# Patient Record
Sex: Female | Born: 1960
Health system: Southern US, Community
[De-identification: ages and names within clinical notes are randomized; demographics above are authoritative.]

---

## 1999-01-15 ENCOUNTER — Other Ambulatory Visit: Admission: RE | Admit: 1999-01-15 | Discharge: 1999-01-15 | Payer: Self-pay | Admitting: Obstetrics and Gynecology

## 1999-08-06 ENCOUNTER — Inpatient Hospital Stay (HOSPITAL_COMMUNITY): Admission: AD | Admit: 1999-08-06 | Discharge: 1999-08-10 | Payer: Self-pay | Admitting: Obstetrics and Gynecology

## 1999-08-11 ENCOUNTER — Encounter: Admission: RE | Admit: 1999-08-11 | Discharge: 1999-11-09 | Payer: Self-pay | Admitting: Obstetrics and Gynecology

## 1999-09-18 ENCOUNTER — Other Ambulatory Visit: Admission: RE | Admit: 1999-09-18 | Discharge: 1999-09-18 | Payer: Self-pay | Admitting: Obstetrics and Gynecology

## 2000-09-07 ENCOUNTER — Encounter (INDEPENDENT_AMBULATORY_CARE_PROVIDER_SITE_OTHER): Payer: Self-pay | Admitting: *Deleted

## 2000-09-07 LAB — CONVERTED CEMR LAB

## 2000-09-12 ENCOUNTER — Encounter: Admission: RE | Admit: 2000-09-12 | Discharge: 2000-09-12 | Payer: Self-pay | Admitting: Family Medicine

## 2001-05-05 ENCOUNTER — Encounter: Admission: RE | Admit: 2001-05-05 | Discharge: 2001-05-05 | Payer: Self-pay | Admitting: Sports Medicine

## 2001-07-25 ENCOUNTER — Other Ambulatory Visit: Admission: RE | Admit: 2001-07-25 | Discharge: 2001-07-25 | Payer: Self-pay | Admitting: Obstetrics and Gynecology

## 2001-08-23 ENCOUNTER — Encounter: Payer: Self-pay | Admitting: Obstetrics & Gynecology

## 2001-08-23 ENCOUNTER — Encounter (INDEPENDENT_AMBULATORY_CARE_PROVIDER_SITE_OTHER): Payer: Self-pay

## 2001-08-23 ENCOUNTER — Ambulatory Visit (HOSPITAL_COMMUNITY): Admission: RE | Admit: 2001-08-23 | Discharge: 2001-08-23 | Payer: Self-pay | Admitting: Obstetrics & Gynecology

## 2002-07-31 ENCOUNTER — Encounter: Admission: RE | Admit: 2002-07-31 | Discharge: 2002-07-31 | Payer: Self-pay | Admitting: Family Medicine

## 2002-09-26 ENCOUNTER — Other Ambulatory Visit: Admission: RE | Admit: 2002-09-26 | Discharge: 2002-09-26 | Payer: Self-pay | Admitting: Obstetrics and Gynecology

## 2002-10-24 ENCOUNTER — Encounter: Payer: Self-pay | Admitting: Obstetrics and Gynecology

## 2002-10-24 ENCOUNTER — Ambulatory Visit (HOSPITAL_COMMUNITY): Admission: RE | Admit: 2002-10-24 | Discharge: 2002-10-24 | Payer: Self-pay | Admitting: Obstetrics and Gynecology

## 2002-11-14 ENCOUNTER — Encounter: Payer: Self-pay | Admitting: Obstetrics and Gynecology

## 2002-11-14 ENCOUNTER — Ambulatory Visit (HOSPITAL_COMMUNITY): Admission: RE | Admit: 2002-11-14 | Discharge: 2002-11-14 | Payer: Self-pay | Admitting: Obstetrics and Gynecology

## 2003-03-29 ENCOUNTER — Encounter: Payer: Self-pay | Admitting: Obstetrics and Gynecology

## 2003-03-29 ENCOUNTER — Encounter (INDEPENDENT_AMBULATORY_CARE_PROVIDER_SITE_OTHER): Payer: Self-pay | Admitting: *Deleted

## 2003-03-29 ENCOUNTER — Inpatient Hospital Stay (HOSPITAL_COMMUNITY): Admission: AD | Admit: 2003-03-29 | Discharge: 2003-04-01 | Payer: Self-pay | Admitting: Obstetrics and Gynecology

## 2003-04-06 ENCOUNTER — Inpatient Hospital Stay (HOSPITAL_COMMUNITY): Admission: AD | Admit: 2003-04-06 | Discharge: 2003-04-06 | Payer: Self-pay | Admitting: Obstetrics and Gynecology

## 2003-06-21 ENCOUNTER — Encounter: Admission: RE | Admit: 2003-06-21 | Discharge: 2003-06-21 | Payer: Self-pay | Admitting: Family Medicine

## 2005-04-30 ENCOUNTER — Other Ambulatory Visit: Admission: RE | Admit: 2005-04-30 | Discharge: 2005-04-30 | Payer: Self-pay | Admitting: Obstetrics and Gynecology

## 2006-11-04 ENCOUNTER — Encounter (INDEPENDENT_AMBULATORY_CARE_PROVIDER_SITE_OTHER): Payer: Self-pay | Admitting: *Deleted

## 2007-09-19 ENCOUNTER — Encounter: Admission: RE | Admit: 2007-09-19 | Discharge: 2007-09-19 | Payer: Self-pay | Admitting: Obstetrics and Gynecology

## 2011-01-22 NOTE — Op Note (Signed)
[facility]  Patient:    [name]                          MRN: [id number] Proc. Date: 08/07/99 Adm. Date:  [id number] Attending:  Lenoard Aden                           Operative Report  PREOPERATIVE DIAGNOSIS:       Intrauterine pregnancy at 40 weeks 6 days, nonreassuring fetal heart rate.  POSTOPERATIVE DIAGNOSIS:      Intrauterine pregnancy at 40 weeks 6 days, nonreassuring fetal heart rate.  OP presentation.  Nuchal cord.  OPERATION:                    Primary low transverse cesarean section.  SURGEON:                      Silverio Lay, M.D.  ASSISTANT:                    Debbora Dus, N.P.  ANESTHESIA:                   Epidural.  ESTIMATED BLOOD LOSS:         600 cc.  DESCRIPTION OF PROCEDURE:     This is a 50 year old white female, gravida 1, being admitted on August 06, 1999, to undergo elective induction of labor for postdates with a very unfavorable cervix.  She was given Cervidil from August 06, 1999, at 4 a.m. until 3 p.m. in the afternoon and then Pitocin was started and artificial rupture of membranes was done.  She maintained a slow progress of labor, but had a few episodes of prolonged decellerations especially with standing up or sitting up with always good recuperation and maintaining good variability.  From 6:30 to 7:30 despite stopping Pitocin, giving the patient a bolus O2, and putting her in left lateral decubitus, fetal heart rate maintained severe variable decellerations at 40 beats per minute lasting 60 to 100 seconds.  Vaginal examination at that time was 8 cm with anterior lip edema, OP presentation -1.  Contractions were spaced every  five to seven minutes.  In view of the nonreassuring fetal heart rate, cesarean  section was suggested to the patient.  The procedure and complications including bleeding, infection, and trauma to the adjacent organs was explained to both the patient and husband  and consent was obtained.  The patient was taken to OR#2.  Epidural anesthesia was reinforced.  The patient was placed in the dorsal decubitus with pelvis tilted to the left.  She was prepped and draped in the usual sterile fashion.  A Foley catheter was already in placed. After assessing adequate level of anesthesia, the skin, subcutaneous tissue, and fat were incised in a low transverse fashion.  The fascia was incised in a low transverse fashion.  Linea alba was dissected and the peritoneum was entered in the midline fashion.  The visceroperitoneum was entered in a low transverse fashion  allowing [place] to develop a bladder flap and to retract bladder with a retractor. The uterus was then entered in the low transverse fashion first with scissors and then with knife.  Amniotic fluid was clear.  We assisted the birth of a female infant n OP presentation at 8:18 a.m.  The mouth and nose were suctioned before delivery of shoulders  and nuchal cord was reduced.  Cord was clamped with two Kelly clamps nd sectioned and the baby was given to the pediatrician present in the room.  20 cc of blood were drawn from the umbilical vein and 3 cc of blood was drawn from the umbilical artery for cord pH.  The placenta was spontaneously expelled, was complete, cord had three vessels, and uterine revision was negative.  The uterus was then closed in two layers, first with a running locked suture of 0 Vicryl, nd then with a Lambert running suture of 0 Vicryl covering the first one. Hemostasis on the uterine incision was assessed and adequate.  Both pericolic gutters were  cleansed.  Both tubes and ovaries assessed and normal.  Hemostasis was reassessed and normal.  Under fascia hemostasis was completed with cautery and fascia was closed with two running sutures of 0 Vicryl meeting in the midline.  The fat was irrigated with normal saline and hemostasis was obtained with cautery.  The skin was  infiltrated with Marcaine 0.25% 10 cc and the skin was closed with staples.  Estimated blood loss was 600 cc.  Sponge, needle, and instrument counts were correct x 2.  The baby received Apgars of 8 at one minute and 9 at five minutes. Weight 7 pounds 7 ounces, and cord pH was 7.2.  The procedure was very well tolerated by the patient who was taken to the recovery room in a well and stable condition. DD:  08/07/99 TD:  08/09/99 Job: [id number]

## 2011-01-22 NOTE — H&P (Signed)
   NAMEKENLEIGH, TOBACK                            ACCOUNT NO.:  [id number]   MEDICAL RECORD NO.:  [id number]                   PATIENT TYPE:  INP   LOCATION:  NA                                   [facility]   PHYSICIAN:  Sandra A. Rivard, M.D.              DATE OF BIRTH:  1961-02-19   DATE OF ADMISSION:  03/29/2003  DATE OF DISCHARGE:                                HISTORY & PHYSICAL   HISTORY OF PRESENT ILLNESS:  This is a 50 year old, G3, P1-0-1-1 at 37-6/7  weeks who presents for an elective repeat cesarean section.  Pregnancy has  been followed by Dr. Estanislado Pandy and remarkable for previous C-section desiring  repeat and BTL, AMA with normal amniocentesis and desiring elective  sterilization.   ALLERGIES:  No known drug allergies.   PAST OBSTETRICAL HISTORY:  Remarkable for cesarean section in 2000, of a  female infant at 88 weeks' gestation weighing 7 pounds 14 ounces, remarkable  for a body cord and fetal distress.  She had an elective abortion of twin  gestation in 2002, at 39 weeks' gestation.   PAST MEDICAL HISTORY:  1. Childhood varicella.  2. History of constipation.   PAST SURGICAL HISTORY:  1. Tonsillectomy in 11th grade.  2. D&C in 2002, with abortion.  3. C-section in 2000.   FAMILY HISTORY:  Remarkable for mother with varicosities.  Father with adult-  onset diabetes.   GENETIC HISTORY:  Remarkable for the patient's advanced maternal age.   SOCIAL HISTORY:  The patient is married to [employer] who is involved  and supportive.  She is a Futures trader.  She does not report a religious  affiliation.  She denies any alcohol, tobacco or drug use.   PHYSICAL EXAMINATION:  VITAL SIGNS:  Stable, afebrile.  HEENT:  Within normal limits.  Thyroid normal, not enlarged.  LUNGS:  Clear to auscultation.  HEART:  Regular rate and rhythm.  ABDOMEN:  Gravid.  PELVIC:  Deferred.  EXTREMITIES:  Within normal limits.   ASSESSMENT:  1. Intrauterine pregnancy at 37-6/7  weeks.  2. Previous cesarean section.  3. Desires repeat cesarean section and elective sterilization.    PLAN:  1. Admit to operating suite.  2. Further orders to follow.     Marie L. Williams, C.N.M.                 Crist Fat Rivard, M.D.    MLW/MEDQ  D:  03/29/2003  T:  03/29/2003  Job:  [id number]

## 2011-01-22 NOTE — Op Note (Signed)
   [name], [name]                            ACCOUNT NO.:  [id number]   MEDICAL RECORD NO.:  [id number]                   PATIENT TYPE:  OUT   LOCATION:  ULT                                  [facility]   PHYSICIAN:  Crist Fat. Rivard, M.D.              DATE OF BIRTH:  [birth year]   DATE OF PROCEDURE:  10/24/2002  DATE OF DISCHARGE:                                 OPERATIVE REPORT   PREOPERATIVE DIAGNOSES:  1. Intrauterine pregnancy at 15 weeks and 4 days.  2. Advanced maternal age.   POSTOPERATIVE DIAGNOSES:  1. Intrauterine pregnancy at 15 weeks and 4 days.  2. Advanced maternal age.   PROCEDURE:  Genetic amniocentesis.   SURGEON:  Crist Fat. Rivard, M.D.   COMPLICATIONS:  None.   PROCEDURE:  After being informed of the planned procedure with possible  complications including bleeding, infection, rupture of membranes, and  miscarriage, risks of 1 in 200, informed consent is obtained.  The patient  is in a dorsal decubitus position and a brief ultrasound is performed  locating an anterior left placenta, live intrauterine pregnancy with a fetal  heart rate of 130 beats per minute.  After locating a large pocket of  amniotic fluid, abdomen is prepped with Betadine.  Local infiltration with  lidocaine 1% using a 22-gauge Sono-VU needle we perform single puncture and  retrieve 1 mL of amniotic fluid.  For unknown reason are unable to retrieve  more fluid.  The needle is removed and a second site is chosen, again  prepped with Betadine.  Local infiltration with lidocaine 1%, 22-gauge  needle under ultrasound guidance.  We are now able to retrieve 24 mL of  clear fluid.  The needle is removed.  Post amniocentesis ultrasound is  normal with a heart rate of 133 beats per minute. The procedure is well  tolerated by the patient who is sent home on rest for 24 hours with routine  recommendations.                                               Crist Fat Rivard, M.D.    SAR/MEDQ   D:  10/24/2002  T:  10/24/2002  Job:  [id number]

## 2011-01-22 NOTE — Op Note (Signed)
[facility]  Patient:    [name], WIN Visit Number: [id number] MRN: [id number]          Service Type: DSU Location: [facility] Attending Physician:  Genia Del Dictated by:   Genia Del, M.D. Proc. 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  Decision to terminate.  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  Decision to terminate.  OPERATION:                    Dilatation and extraction under ultrasound guidance.  SURGEON:                      Genia Del, M.D.  ASSISTANT:  ANESTHESIA:                   Raul Del, M.D.  ESTIMATED BLOOD LOSS:  DESCRIPTION OF PROCEDURE:     Under general anesthesia with endotracheal intubation, the patient is in the lithotomy position.  She is prepped with Betadine on the suprapubic, vulvar, and vaginal area, and then draped as usual.  Vaginally, the sponge and the medium laminaria are removed.  We then inserted the speculum.  The cervix is grasped with a tenaculum on the anterior lip.  Dilatation is done with Hegar dilators easily up to #51.  A suction curet #16 is then used to rupture the membranes and suction the amniotic fluid.  We then used a large clamp with piece to remove fetal parts.  Fetal parts are counted.  We sent in a separate container parts from fetus A and B for chromosomal analysis.  We then used the large sharp curet for the placenta which was located posteriorly and came back at the end with the suction curet. All of the procedure is done under ultrasound guidance.  We confirmed an empty uterine cavity by ultrasound and the uterine sound is heard on all surfaces. The  uterus is contracting well and bleeding is minimal.  The estimated blood loss was 200 cc.  Urine was clear 150 cc at the end of the surgery.  No complications occurred and the patient was transferred to recovery room in good status.  The rest of the products of conception were sent for pathology. Her blood group is A negative, RhoGAM will be given postoperatively.  She will be sent home when stable on Flagyl 375 mg p.o. b.i.d. x 7 days and Tylox p.r.n.  She will follow up with me at [facility] in two weeks. Dictated by:   Genia Del, M.D. Attending Physician:  Genia Del DD:  08/23/01 TD:  08/23/01 Job: [id number]

## 2011-01-22 NOTE — Op Note (Signed)
[name], [name]                            ACCOUNT NO.:  [id number]   MEDICAL RECORD NO.:  [id number]                   PATIENT TYPE:  INP   LOCATION:  9147                                 [facility]   PHYSICIAN:  Crist Fat. Rivard, M.D.              DATE OF BIRTH:  10-May-1961   DATE OF PROCEDURE:  03/29/2003  DATE OF DISCHARGE:                                 OPERATIVE REPORT   PREOPERATIVE DIAGNOSES:  1. Intrauterine pregnancy at 38 weeks.  2. Previous cesarean section.  3. Desire for repeat cesarean section and for sterilization.   POSTOPERATIVE DIAGNOSES:  1. Intrauterine pregnancy at 38 weeks.  2. Previous cesarean section.  3. Desire for repeat cesarean section and for sterilization.   ANESTHESIA:  Spinal.   PROCEDURES:  Repeat low transverse cesarean section with bilateral tubal  ligation.   SURGEON:  Crist Fat. Rivard, M.D.   ASSISTANT:  Elby Showers. Williams, C.N.M.   ESTIMATED BLOOD LOSS:  500 mL.   PROCEDURE:  After being informed of the planned procedure with possible  complications including bleeding, infection, injury to bowels, bladder, or  ureters, irreversibility of tubal ligation as well as failure rate of one in  500, informed consent was obtained.  The patient was taken to OR #1, given  spinal anesthesia without any complication, and placed in the dorsal  decubitus position, pelvis tilted to the left.  She was prepped and draped  in a sterile fashion and a Foley catheter was inserted in her bladder.  After assessing adequate level of anesthesia, we infiltrated the suprapubic  area with 20 mL of Marcaine 0.25% and proceeded with Pfannenstiel incision  overlooking the previous incision, which was brought down to the fascia.  The fascia was incised in a low transverse fashion, linea alba was  dissected, peritoneum was entered in a midline fashion.  The visceral  peritoneum was entered in a low transverse fashion, allowing [place] to develop  bladder flap  and safely retract bladder to open myometrium in a low  transverse fashion, first with knife, then bluntly.  Amniotic fluid was  clear.  We assisted the birth of a female infant in [place] at 1:15 named  Lequita Halt.  Mouth and nose were suctioned, cord was clamped with two Kelly  clamps and sectioned, and the baby was given to [employer]. Alison Murray, M.D.,  present in the room.  We drew 20 mL of blood from the umbilical vein, and  the placenta was allowed to deliver spontaneously.  It was complete, cord  had three vessels, and uterine revision was negative.   We proceeded with closure of myometrium in two layers, first with a running  locked suture of 0 Vicryl, then with a Lembert suture of 0 Vicryl covering  the first one.  Hemostasis was completed on the edges of the visceral  peritoneum with cautery.  Both paracolic gutters were cleansed, both  tubes  and ovaries normal.  The pelvis was profusely irrigated with warm saline and  hemostasis was reassessed and adequate.  We then proceeded with the tubal  ligation, first on the left side, then on the right side, where each tube  was isolated with two Babcock forceps.  The mesosalpinx was entered with  cautery.  Each tube was ligated four times with 0 chromic and a section of  1.5 cm in the isthmic-ampullary area was removed.  Both stumps were then  cauterized.  Hemostasis was complete and satisfactory.   Under fascia hemostasis was completed with cautery and fascia was closed  with two running sutures of 0 Vicryl, meeting midline.  The wound was  irrigated with warm saline, hemostasis was completed with cautery, and skin  was closed with staples.   Instrument and sponge count was incomplete, missing a Vacutainer from the  initial table.  An x-ray was negative.  The rest of the count was normal.  Estimated blood loss was 500 mL.  A little girl named Lequita Halt was born at  1:15, received an Apgar of 9 at one minute and 9 at five minutes, and  weighed 7  pounds 1 ounce.  The procedure was very well-tolerated by the  patient, who was taken to the recovery room in a well and stable condition.                                               Crist Fat Rivard, M.D.    SAR/MEDQ  D:  03/29/2003  T:  03/30/2003  Job:  [id number]

## 2011-01-22 NOTE — Discharge Summary (Signed)
   [name], [name]                            ACCOUNT NO.:  [id number]   MEDICAL RECORD NO.:  [id number]                   PATIENT TYPE:  INP   LOCATION:  9147                                 [facility]   PHYSICIAN:  Osborn Coho, M.D.                DATE OF BIRTH:  11-04-1960   DATE OF ADMISSION:  03/29/2003  DATE OF DISCHARGE:                                 DISCHARGE SUMMARY   ADMISSION DIAGNOSES:  1. Intrauterine pregnancy at term.  2. Previous cesarean section.  3. Desires for sterilization.   DISCHARGE DIAGNOSES:  1. Intrauterine pregnancy at term.  2. Previous cesarean section.  3. Desires for sterilization.  4. Bottle feeding.  5. History of postpartum depression.   PROCEDURES THIS ADMISSION:  1. Repeat low transverse cesarean section.  2. Bilateral tubal ligation for sterilization on March 29, 2003.   HOSPITAL COURSE:  [name] is a 50 year old married white female, gravida  3, para 1-0-1-1, at term admitted for repeat cesarean section secondary to  history of previous cesarean section and underwent the same for delivery of  a viable female infant named [name] who weighed 7 pounds 1 ounce, and had  Apgars of  9 and 9 on March 29, 2003.  The patient also desired bilateral  tubal ligation for sterilization and underwent the same during the time of  her C-section and was attended in operation by Dr. Silverio Lay and Wynelle Bourgeois, C.N.M.  Postoperatively, the patient has done well.  She is  ambulating, voiding, and eating without difficulty.  Her vital signs are  stable and she has been afebrile throughout her hospital course.  She is  bottle feeding without difficulty and is currently complaining of some  tearfulness and wants to have a medication for depression and has used  Wellbutrin in the past.  She did have some issues following her miscarriage,  requiring medication.  She is deemed ready for discharge today.  Her  discharge instructions are as per  the [facility] handout.   DISCHARGE MEDICATIONS:  Are Motrin 600 mg p.o. q.6h p.r.n. for pain, Tylox  one to two p.o. q.4-6h p.r.n. for pain, and Wellbutrin 100 mg p.o. b.i.d.,  with #60 and one refill, and prenatal vitamins daily.   DISCHARGE LABS:  Her hemoglobin is 10.6.  Her WBC count is 7.2.  Platelets  are 201.   DISCHARGE FOLLOWUP:  Will be Thursday at [facility] for staple  removal and for her routine six week postpartum check or p.r.n.     Concha Pyo. [place], C.N.M.              Osborn Coho, M.D.   SJD/MEDQ  D:  04/01/2003  T:  04/01/2003  Job:  [id number]

## 2012-09-21 ENCOUNTER — Other Ambulatory Visit: Payer: Self-pay | Admitting: Obstetrics and Gynecology

## 2012-09-21 DIAGNOSIS — Z1231 Encounter for screening mammogram for malignant neoplasm of breast: Secondary | ICD-10-CM

## 2012-10-19 ENCOUNTER — Ambulatory Visit: Payer: Self-pay

## 2012-10-24 ENCOUNTER — Ambulatory Visit
Admission: RE | Admit: 2012-10-24 | Discharge: 2012-10-24 | Disposition: A | Discharge status: Home / Self Care | Payer: BC Managed Care – PPO | Source: Ambulatory Visit | Attending: Obstetrics and Gynecology | Admitting: Obstetrics and Gynecology

## 2012-10-24 DIAGNOSIS — Z1231 Encounter for screening mammogram for malignant neoplasm of breast: Secondary | ICD-10-CM

## 2012-10-25 ENCOUNTER — Other Ambulatory Visit: Payer: Self-pay | Admitting: Obstetrics and Gynecology

## 2012-10-25 DIAGNOSIS — R928 Other abnormal and inconclusive findings on diagnostic imaging of breast: Secondary | ICD-10-CM

## 2012-10-26 NOTE — Progress Notes (Signed)
Quick Note:  The recent mammogram is incomplete / abnormal suggesting a close follow-up / additional images. When is the next appointment to follow-up on this mammogram?  Please document in chart. ______ 

## 2012-11-01 ENCOUNTER — Telehealth: Payer: Self-pay | Admitting: Obstetrics and Gynecology

## 2012-11-03 ENCOUNTER — Ambulatory Visit
Admission: RE | Admit: 2012-11-03 | Discharge: 2012-11-03 | Disposition: A | Discharge status: Home / Self Care | Payer: BC Managed Care – PPO | Source: Ambulatory Visit | Attending: Obstetrics and Gynecology | Admitting: Obstetrics and Gynecology

## 2013-05-15 ENCOUNTER — Other Ambulatory Visit: Payer: Self-pay | Admitting: Obstetrics and Gynecology

## 2013-05-15 DIAGNOSIS — R921 Mammographic calcification found on diagnostic imaging of breast: Secondary | ICD-10-CM

## 2013-06-05 ENCOUNTER — Ambulatory Visit
Admission: RE | Admit: 2013-06-05 | Discharge: 2013-06-05 | Disposition: A | Discharge status: Home / Self Care | Payer: BC Managed Care – PPO | Source: Ambulatory Visit | Attending: Obstetrics and Gynecology | Admitting: Obstetrics and Gynecology

## 2013-06-05 DIAGNOSIS — R921 Mammographic calcification found on diagnostic imaging of breast: Secondary | ICD-10-CM

## 2014-05-23 ENCOUNTER — Other Ambulatory Visit: Payer: Self-pay | Admitting: Dermatology

## 2014-09-20 ENCOUNTER — Other Ambulatory Visit: Payer: Self-pay | Admitting: Obstetrics and Gynecology

## 2014-09-20 DIAGNOSIS — R921 Mammographic calcification found on diagnostic imaging of breast: Secondary | ICD-10-CM

## 2014-10-01 ENCOUNTER — Inpatient Hospital Stay: Admission: RE | Admit: 2014-10-01 | Payer: Self-pay | Source: Ambulatory Visit

## 2014-10-09 ENCOUNTER — Other Ambulatory Visit: Payer: Self-pay | Admitting: Obstetrics and Gynecology

## 2014-10-09 ENCOUNTER — Ambulatory Visit
Admission: RE | Admit: 2014-10-09 | Discharge: 2014-10-09 | Disposition: A | Discharge status: Home / Self Care | Payer: Self-pay | Source: Ambulatory Visit | Attending: Obstetrics and Gynecology | Admitting: Obstetrics and Gynecology

## 2014-10-09 DIAGNOSIS — R928 Other abnormal and inconclusive findings on diagnostic imaging of breast: Secondary | ICD-10-CM

## 2014-10-09 DIAGNOSIS — R921 Mammographic calcification found on diagnostic imaging of breast: Secondary | ICD-10-CM

## 2015-09-10 ENCOUNTER — Other Ambulatory Visit: Payer: Self-pay

## 2015-09-10 DIAGNOSIS — Z1231 Encounter for screening mammogram for malignant neoplasm of breast: Secondary | ICD-10-CM

## 2015-10-29 ENCOUNTER — Ambulatory Visit: Payer: Self-pay

## 2015-11-06 ENCOUNTER — Ambulatory Visit
Admission: RE | Admit: 2015-11-06 | Discharge: 2015-11-06 | Disposition: A | Discharge status: Home / Self Care | Payer: No Typology Code available for payment source | Source: Ambulatory Visit

## 2015-11-06 DIAGNOSIS — Z1231 Encounter for screening mammogram for malignant neoplasm of breast: Secondary | ICD-10-CM

## 2016-09-13 ENCOUNTER — Other Ambulatory Visit: Payer: Self-pay | Admitting: Dermatology

## 2017-05-31 ENCOUNTER — Ambulatory Visit (INDEPENDENT_AMBULATORY_CARE_PROVIDER_SITE_OTHER): Payer: No Typology Code available for payment source | Admitting: Physician Assistant

## 2017-05-31 ENCOUNTER — Encounter: Payer: Self-pay | Admitting: Physician Assistant

## 2017-05-31 VITALS — BP 128/82 | HR 81 | Temp 98.2°F | Resp 17 | Ht 68.5 in | Wt 203.0 lb

## 2017-05-31 DIAGNOSIS — J069 Acute upper respiratory infection, unspecified: Secondary | ICD-10-CM | POA: Diagnosis not present

## 2017-05-31 DIAGNOSIS — J029 Acute pharyngitis, unspecified: Secondary | ICD-10-CM | POA: Diagnosis not present

## 2017-05-31 LAB — POCT RAPID STREP A (OFFICE): Rapid Strep A Screen: NEGATIVE

## 2017-05-31 MED ORDER — GUAIFENESIN ER 1200 MG PO TB12: 1.0000 | ORAL_TABLET | Freq: Two times a day (BID) | ORAL | 1 refills | Status: AC | PRN

## 2017-05-31 MED ORDER — FLUTICASONE PROPIONATE 50 MCG/ACT NA SUSP: 2.0000 | Freq: Every day | NASAL | 12 refills | Status: AC

## 2017-05-31 NOTE — Patient Instructions (Addendum)
This appears to be viral.  You can use warm salt water gargles for the throat, as well as you can use tylenol or ibuprofen for pain.  You can also use cepacol THROAT lozenges for the pain.   I would like you to use the flonase to help rid some of this mucus behind the eustachian tube.  This may also help the throat pain, if the inflammation is caused by more of a post nasal drip of mucus.    Upper Respiratory Infection, Adult Most upper respiratory infections (URIs) are caused by a virus. A URI affects the nose, throat, and upper air passages. The most common type of URI is often called "the common cold." Follow these instructions at home:  Take medicines only as told by your doctor.  Gargle warm saltwater or take cough drops to comfort your throat as told by your doctor.  Use a warm mist humidifier or inhale steam from a shower to increase air moisture. This may make it easier to breathe.  Drink enough fluid to keep your pee (urine) clear or pale yellow.  Eat soups and other clear broths.  Have a healthy diet.  Rest as needed.  Go back to work when your fever is gone or your doctor says it is okay. ? You may need to stay home longer to avoid giving your URI to others. ? You can also wear a face mask and wash your hands often to prevent spread of the virus.  Use your inhaler more if you have asthma.  Do not use any tobacco products, including cigarettes, chewing tobacco, or electronic cigarettes. If you need help quitting, ask your doctor. Contact a doctor if:  You are getting worse, not better.  Your symptoms are not helped by medicine.  You have chills.  You are getting more short of breath.  You have brown or red mucus.  You have yellow or brown discharge from your nose.  You have pain in your face, especially when you bend forward.  You have a fever.  You have puffy (swollen) neck glands.  You have pain while swallowing.  You have white areas in the back of your  throat. Get help right away if:  You have very bad or constant: ? Headache. ? Ear pain. ? Pain in your forehead, behind your eyes, and over your cheekbones (sinus pain). ? Chest pain.  You have long-lasting (chronic) lung disease and any of the following: ? Wheezing. ? Long-lasting cough. ? Coughing up blood. ? A change in your usual mucus.  You have a stiff neck.  You have changes in your: ? Vision. ? Hearing. ? Thinking. ? Mood. This information is not intended to replace advice given to you by your health care provider. Make sure you discuss any questions you have with your health care provider. Document Released: 02/09/2008 Document Revised: 04/25/2016 Document Reviewed: 11/28/2013 Elsevier Interactive Patient Education  2018 [employer].      IF you received an x-ray today, you will receive an invoice from [facility]. Please contact [facility] at [phone] with questions or concerns regarding your invoice.   IF you received labwork today, you will receive an invoice from [place]. Please contact [facility] at [phone] with questions or concerns regarding your invoice.   Our billing staff will not be able to assist you with questions regarding bills from these companies.  You will be contacted with the lab results as soon as they are available. The fastest way to get  your results is to activate your My Chart account. Instructions are located on the last page of this paperwork. If you have not heard from [place] regarding the results in 2 weeks, please contact this office.

## 2017-05-31 NOTE — Progress Notes (Signed)
PRIMARY CARE AT [facility] [address], [place] [address] [phone]  Date:  05/31/2017   Name:  [name]   DOB:  09/30/1960   MRN:  [id number]  PCP:  Patient, No Pcp Per    History of Present Illness:  [name] is a 56 y.o. female patient who presents to PCP with  Chief Complaint  Patient presents with  . Sore Throat  . Ear Pain     Yesterday, her ear felt clogged.  By night time, she had a sore throat, and difficulty with swallowing.  her ear feels like it is hurting in and out.  No nasal congestion or cough.  The ears feel full at this time.  No sob or dyspnea reported.   No known sick contacts.   She has been volunteering at a high school.   There are no active problems to display for this patient.   No past medical history on file.  No past surgical history on file.  Social History  Substance Use Topics  . Smoking status: Never Smoker  . Smokeless tobacco: Never Used  . Alcohol use No    No family history on file.  Not on File  Medication list has been reviewed and updated.  No current outpatient prescriptions on file prior to visit.   No current facility-administered medications on file prior to visit.     ROS ROS otherwise unremarkable unless listed above.  Physical Examination: BP 128/82   Pulse 81   Temp 98.2 F (36.8 C) (Oral)   Resp 17   Ht 5' 8.5" (1.74 m)   Wt 203 lb (92.1 kg)   SpO2 98%   BMI 30.42 kg/m  Ideal Body Weight: Weight in (lb) to have BMI = 25: 166.5  Physical Exam  Constitutional: She is oriented to person, place, and time. She appears well-developed and well-nourished. No distress.  HENT:  Head: Normocephalic and atraumatic.  Right Ear: Tympanic membrane, external ear and ear canal normal.  Left Ear: Tympanic membrane, external ear and ear canal normal.  Nose: Mucosal edema (mild) and rhinorrhea present. Right sinus exhibits no maxillary sinus tenderness and no frontal sinus tenderness. Left sinus exhibits no  maxillary sinus tenderness and no frontal sinus tenderness.  Mouth/Throat: No uvula swelling. No oropharyngeal exudate, posterior oropharyngeal edema or posterior oropharyngeal erythema.  Eyes: Pupils are equal, round, and reactive to light. Conjunctivae and EOM are normal.  Cardiovascular: Normal rate and regular rhythm.  Exam reveals no gallop, no distant heart sounds and no friction rub.   No murmur heard. Pulmonary/Chest: Effort normal. No respiratory distress. She has no decreased breath sounds. She has no wheezes. She has no rhonchi.  Lymphadenopathy:       Head (right side): No submandibular, no tonsillar, no preauricular and no posterior auricular adenopathy present.       Head (left side): No submandibular, no tonsillar, no preauricular and no posterior auricular adenopathy present.  Neurological: She is alert and oriented to person, place, and time.  Skin: She is not diaphoretic.  Psychiatric: She has a normal mood and affect. Her behavior is normal.    Results for orders placed or performed in visit on 05/31/17  POCT rapid strep A  Result Value Ref Range   Rapid Strep A Screen Negative Negative     Assessment and Plan: [name] is a 56 y.o. female who is here today Appears to be an upper respiratory virus.  Advised supportive treatment at this time.  May take up to 7-10 days to clear.   Viral upper respiratory tract infection - Plan: POCT rapid strep A, Guaifenesin (MUCINEX MAXIMUM STRENGTH) 1200 MG TB12, fluticasone (FLONASE) 50 MCG/ACT nasal spray  Sore throat - Plan: POCT rapid strep A, Guaifenesin (MUCINEX MAXIMUM STRENGTH) 1200 MG TB12, fluticasone (FLONASE) 50 MCG/ACT nasal spray  Trena Platt, PA-C [facility] 9/25/201810:41 AM

## 2017-06-15 ENCOUNTER — Other Ambulatory Visit: Payer: Self-pay | Admitting: Obstetrics and Gynecology

## 2017-06-15 DIAGNOSIS — Z1231 Encounter for screening mammogram for malignant neoplasm of breast: Secondary | ICD-10-CM

## 2017-07-11 ENCOUNTER — Ambulatory Visit: Payer: No Typology Code available for payment source

## 2017-08-24 ENCOUNTER — Ambulatory Visit: Payer: No Typology Code available for payment source

## 2017-12-07 ENCOUNTER — Encounter: Payer: Self-pay | Admitting: Physician Assistant

## 2018-01-09 ENCOUNTER — Ambulatory Visit: Payer: No Typology Code available for payment source

## 2018-01-12 ENCOUNTER — Ambulatory Visit
Admission: RE | Admit: 2018-01-12 | Discharge: 2018-01-12 | Disposition: A | Discharge status: Home / Self Care | Payer: No Typology Code available for payment source | Source: Ambulatory Visit | Attending: Obstetrics and Gynecology | Admitting: Obstetrics and Gynecology

## 2018-01-12 DIAGNOSIS — Z1231 Encounter for screening mammogram for malignant neoplasm of breast: Secondary | ICD-10-CM

## 2018-05-03 DIAGNOSIS — R5383 Other fatigue: Secondary | ICD-10-CM | POA: Diagnosis not present

## 2018-05-03 DIAGNOSIS — N951 Menopausal and female climacteric states: Secondary | ICD-10-CM | POA: Diagnosis not present

## 2018-05-03 DIAGNOSIS — E538 Deficiency of other specified B group vitamins: Secondary | ICD-10-CM | POA: Diagnosis not present

## 2018-05-03 DIAGNOSIS — M2550 Pain in unspecified joint: Secondary | ICD-10-CM | POA: Diagnosis not present

## 2018-05-03 DIAGNOSIS — R5381 Other malaise: Secondary | ICD-10-CM | POA: Diagnosis not present

## 2018-05-03 DIAGNOSIS — E039 Hypothyroidism, unspecified: Secondary | ICD-10-CM | POA: Diagnosis not present

## 2018-05-03 DIAGNOSIS — E559 Vitamin D deficiency, unspecified: Secondary | ICD-10-CM | POA: Diagnosis not present

## 2018-05-03 DIAGNOSIS — Z131 Encounter for screening for diabetes mellitus: Secondary | ICD-10-CM | POA: Diagnosis not present

## 2018-06-02 DIAGNOSIS — E039 Hypothyroidism, unspecified: Secondary | ICD-10-CM | POA: Diagnosis not present

## 2018-06-02 DIAGNOSIS — Z6831 Body mass index (BMI) 31.0-31.9, adult: Secondary | ICD-10-CM | POA: Diagnosis not present

## 2018-06-02 DIAGNOSIS — R5381 Other malaise: Secondary | ICD-10-CM | POA: Diagnosis not present

## 2018-06-02 DIAGNOSIS — R5383 Other fatigue: Secondary | ICD-10-CM | POA: Diagnosis not present

## 2018-06-02 DIAGNOSIS — Z23 Encounter for immunization: Secondary | ICD-10-CM | POA: Diagnosis not present

## 2018-06-02 DIAGNOSIS — N951 Menopausal and female climacteric states: Secondary | ICD-10-CM | POA: Diagnosis not present

## 2018-06-02 DIAGNOSIS — K219 Gastro-esophageal reflux disease without esophagitis: Secondary | ICD-10-CM | POA: Diagnosis not present

## 2018-06-02 DIAGNOSIS — J309 Allergic rhinitis, unspecified: Secondary | ICD-10-CM | POA: Diagnosis not present

## 2018-08-09 DIAGNOSIS — R05 Cough: Secondary | ICD-10-CM | POA: Diagnosis not present

## 2018-08-21 DIAGNOSIS — M461 Sacroiliitis, not elsewhere classified: Secondary | ICD-10-CM | POA: Diagnosis not present

## 2018-08-21 DIAGNOSIS — M5432 Sciatica, left side: Secondary | ICD-10-CM | POA: Diagnosis not present

## 2018-08-21 DIAGNOSIS — M5431 Sciatica, right side: Secondary | ICD-10-CM | POA: Diagnosis not present

## 2018-08-21 DIAGNOSIS — M5416 Radiculopathy, lumbar region: Secondary | ICD-10-CM | POA: Diagnosis not present

## 2018-08-22 DIAGNOSIS — M5432 Sciatica, left side: Secondary | ICD-10-CM | POA: Diagnosis not present

## 2018-08-22 DIAGNOSIS — M5431 Sciatica, right side: Secondary | ICD-10-CM | POA: Diagnosis not present

## 2018-08-22 DIAGNOSIS — M461 Sacroiliitis, not elsewhere classified: Secondary | ICD-10-CM | POA: Diagnosis not present

## 2018-08-22 DIAGNOSIS — M5416 Radiculopathy, lumbar region: Secondary | ICD-10-CM | POA: Diagnosis not present

## 2018-08-23 DIAGNOSIS — M5416 Radiculopathy, lumbar region: Secondary | ICD-10-CM | POA: Diagnosis not present

## 2018-08-23 DIAGNOSIS — M461 Sacroiliitis, not elsewhere classified: Secondary | ICD-10-CM | POA: Diagnosis not present

## 2018-08-23 DIAGNOSIS — M5431 Sciatica, right side: Secondary | ICD-10-CM | POA: Diagnosis not present

## 2018-08-23 DIAGNOSIS — M5432 Sciatica, left side: Secondary | ICD-10-CM | POA: Diagnosis not present

## 2018-08-24 DIAGNOSIS — M5416 Radiculopathy, lumbar region: Secondary | ICD-10-CM | POA: Diagnosis not present

## 2018-08-24 DIAGNOSIS — M5432 Sciatica, left side: Secondary | ICD-10-CM | POA: Diagnosis not present

## 2018-08-24 DIAGNOSIS — M461 Sacroiliitis, not elsewhere classified: Secondary | ICD-10-CM | POA: Diagnosis not present

## 2018-08-24 DIAGNOSIS — M5431 Sciatica, right side: Secondary | ICD-10-CM | POA: Diagnosis not present

## 2018-09-11 DIAGNOSIS — M5432 Sciatica, left side: Secondary | ICD-10-CM | POA: Diagnosis not present

## 2018-09-11 DIAGNOSIS — M5416 Radiculopathy, lumbar region: Secondary | ICD-10-CM | POA: Diagnosis not present

## 2018-09-11 DIAGNOSIS — M461 Sacroiliitis, not elsewhere classified: Secondary | ICD-10-CM | POA: Diagnosis not present

## 2018-09-11 DIAGNOSIS — M5431 Sciatica, right side: Secondary | ICD-10-CM | POA: Diagnosis not present

## 2018-09-12 DIAGNOSIS — M5431 Sciatica, right side: Secondary | ICD-10-CM | POA: Diagnosis not present

## 2018-09-12 DIAGNOSIS — M5416 Radiculopathy, lumbar region: Secondary | ICD-10-CM | POA: Diagnosis not present

## 2018-09-12 DIAGNOSIS — M5432 Sciatica, left side: Secondary | ICD-10-CM | POA: Diagnosis not present

## 2018-09-12 DIAGNOSIS — M461 Sacroiliitis, not elsewhere classified: Secondary | ICD-10-CM | POA: Diagnosis not present

## 2018-09-14 DIAGNOSIS — M461 Sacroiliitis, not elsewhere classified: Secondary | ICD-10-CM | POA: Diagnosis not present

## 2018-09-14 DIAGNOSIS — M5431 Sciatica, right side: Secondary | ICD-10-CM | POA: Diagnosis not present

## 2018-09-14 DIAGNOSIS — M5432 Sciatica, left side: Secondary | ICD-10-CM | POA: Diagnosis not present

## 2018-09-14 DIAGNOSIS — M5416 Radiculopathy, lumbar region: Secondary | ICD-10-CM | POA: Diagnosis not present

## 2018-09-15 DIAGNOSIS — M461 Sacroiliitis, not elsewhere classified: Secondary | ICD-10-CM | POA: Diagnosis not present

## 2018-09-15 DIAGNOSIS — M5431 Sciatica, right side: Secondary | ICD-10-CM | POA: Diagnosis not present

## 2018-09-15 DIAGNOSIS — M5416 Radiculopathy, lumbar region: Secondary | ICD-10-CM | POA: Diagnosis not present

## 2018-09-15 DIAGNOSIS — M5432 Sciatica, left side: Secondary | ICD-10-CM | POA: Diagnosis not present

## 2018-09-18 DIAGNOSIS — M5416 Radiculopathy, lumbar region: Secondary | ICD-10-CM | POA: Diagnosis not present

## 2018-09-18 DIAGNOSIS — M461 Sacroiliitis, not elsewhere classified: Secondary | ICD-10-CM | POA: Diagnosis not present

## 2018-09-18 DIAGNOSIS — M5432 Sciatica, left side: Secondary | ICD-10-CM | POA: Diagnosis not present

## 2018-09-18 DIAGNOSIS — M5431 Sciatica, right side: Secondary | ICD-10-CM | POA: Diagnosis not present

## 2018-09-26 DIAGNOSIS — M5431 Sciatica, right side: Secondary | ICD-10-CM | POA: Diagnosis not present

## 2018-09-26 DIAGNOSIS — M461 Sacroiliitis, not elsewhere classified: Secondary | ICD-10-CM | POA: Diagnosis not present

## 2018-09-26 DIAGNOSIS — M5416 Radiculopathy, lumbar region: Secondary | ICD-10-CM | POA: Diagnosis not present

## 2018-09-26 DIAGNOSIS — M5432 Sciatica, left side: Secondary | ICD-10-CM | POA: Diagnosis not present

## 2018-09-27 DIAGNOSIS — M5432 Sciatica, left side: Secondary | ICD-10-CM | POA: Diagnosis not present

## 2018-09-27 DIAGNOSIS — M5416 Radiculopathy, lumbar region: Secondary | ICD-10-CM | POA: Diagnosis not present

## 2018-09-27 DIAGNOSIS — M5431 Sciatica, right side: Secondary | ICD-10-CM | POA: Diagnosis not present

## 2018-09-27 DIAGNOSIS — M461 Sacroiliitis, not elsewhere classified: Secondary | ICD-10-CM | POA: Diagnosis not present

## 2018-10-02 DIAGNOSIS — M5431 Sciatica, right side: Secondary | ICD-10-CM | POA: Diagnosis not present

## 2018-10-02 DIAGNOSIS — M5432 Sciatica, left side: Secondary | ICD-10-CM | POA: Diagnosis not present

## 2018-10-02 DIAGNOSIS — M461 Sacroiliitis, not elsewhere classified: Secondary | ICD-10-CM | POA: Diagnosis not present

## 2018-10-02 DIAGNOSIS — M5416 Radiculopathy, lumbar region: Secondary | ICD-10-CM | POA: Diagnosis not present

## 2018-10-03 DIAGNOSIS — M5432 Sciatica, left side: Secondary | ICD-10-CM | POA: Diagnosis not present

## 2018-10-03 DIAGNOSIS — M5416 Radiculopathy, lumbar region: Secondary | ICD-10-CM | POA: Diagnosis not present

## 2018-10-03 DIAGNOSIS — M5431 Sciatica, right side: Secondary | ICD-10-CM | POA: Diagnosis not present

## 2018-10-03 DIAGNOSIS — M461 Sacroiliitis, not elsewhere classified: Secondary | ICD-10-CM | POA: Diagnosis not present

## 2018-10-04 DIAGNOSIS — M5416 Radiculopathy, lumbar region: Secondary | ICD-10-CM | POA: Diagnosis not present

## 2018-10-04 DIAGNOSIS — M5432 Sciatica, left side: Secondary | ICD-10-CM | POA: Diagnosis not present

## 2018-10-04 DIAGNOSIS — M5431 Sciatica, right side: Secondary | ICD-10-CM | POA: Diagnosis not present

## 2018-10-04 DIAGNOSIS — M461 Sacroiliitis, not elsewhere classified: Secondary | ICD-10-CM | POA: Diagnosis not present

## 2018-10-16 DIAGNOSIS — M5416 Radiculopathy, lumbar region: Secondary | ICD-10-CM | POA: Diagnosis not present

## 2018-10-16 DIAGNOSIS — M5432 Sciatica, left side: Secondary | ICD-10-CM | POA: Diagnosis not present

## 2018-10-16 DIAGNOSIS — M461 Sacroiliitis, not elsewhere classified: Secondary | ICD-10-CM | POA: Diagnosis not present

## 2018-10-16 DIAGNOSIS — M5431 Sciatica, right side: Secondary | ICD-10-CM | POA: Diagnosis not present

## 2018-10-18 DIAGNOSIS — M461 Sacroiliitis, not elsewhere classified: Secondary | ICD-10-CM | POA: Diagnosis not present

## 2018-10-18 DIAGNOSIS — M5416 Radiculopathy, lumbar region: Secondary | ICD-10-CM | POA: Diagnosis not present

## 2018-10-18 DIAGNOSIS — M5431 Sciatica, right side: Secondary | ICD-10-CM | POA: Diagnosis not present

## 2018-10-18 DIAGNOSIS — M5432 Sciatica, left side: Secondary | ICD-10-CM | POA: Diagnosis not present

## 2018-10-24 DIAGNOSIS — M5432 Sciatica, left side: Secondary | ICD-10-CM | POA: Diagnosis not present

## 2018-10-24 DIAGNOSIS — M461 Sacroiliitis, not elsewhere classified: Secondary | ICD-10-CM | POA: Diagnosis not present

## 2018-10-24 DIAGNOSIS — M5416 Radiculopathy, lumbar region: Secondary | ICD-10-CM | POA: Diagnosis not present

## 2018-10-24 DIAGNOSIS — M5431 Sciatica, right side: Secondary | ICD-10-CM | POA: Diagnosis not present

## 2018-10-25 DIAGNOSIS — M461 Sacroiliitis, not elsewhere classified: Secondary | ICD-10-CM | POA: Diagnosis not present

## 2018-10-25 DIAGNOSIS — M5432 Sciatica, left side: Secondary | ICD-10-CM | POA: Diagnosis not present

## 2018-10-25 DIAGNOSIS — M5416 Radiculopathy, lumbar region: Secondary | ICD-10-CM | POA: Diagnosis not present

## 2018-10-25 DIAGNOSIS — M5431 Sciatica, right side: Secondary | ICD-10-CM | POA: Diagnosis not present

## 2018-10-26 DIAGNOSIS — M461 Sacroiliitis, not elsewhere classified: Secondary | ICD-10-CM | POA: Diagnosis not present

## 2018-10-26 DIAGNOSIS — M5416 Radiculopathy, lumbar region: Secondary | ICD-10-CM | POA: Diagnosis not present

## 2018-10-26 DIAGNOSIS — M5431 Sciatica, right side: Secondary | ICD-10-CM | POA: Diagnosis not present

## 2018-10-26 DIAGNOSIS — M5432 Sciatica, left side: Secondary | ICD-10-CM | POA: Diagnosis not present

## 2018-11-02 DIAGNOSIS — M5416 Radiculopathy, lumbar region: Secondary | ICD-10-CM | POA: Diagnosis not present

## 2018-11-02 DIAGNOSIS — M5432 Sciatica, left side: Secondary | ICD-10-CM | POA: Diagnosis not present

## 2018-11-02 DIAGNOSIS — M461 Sacroiliitis, not elsewhere classified: Secondary | ICD-10-CM | POA: Diagnosis not present

## 2018-11-02 DIAGNOSIS — M5431 Sciatica, right side: Secondary | ICD-10-CM | POA: Diagnosis not present

## 2018-11-03 DIAGNOSIS — M5431 Sciatica, right side: Secondary | ICD-10-CM | POA: Diagnosis not present

## 2018-11-03 DIAGNOSIS — M5432 Sciatica, left side: Secondary | ICD-10-CM | POA: Diagnosis not present

## 2018-11-03 DIAGNOSIS — M5416 Radiculopathy, lumbar region: Secondary | ICD-10-CM | POA: Diagnosis not present

## 2018-11-03 DIAGNOSIS — M461 Sacroiliitis, not elsewhere classified: Secondary | ICD-10-CM | POA: Diagnosis not present

## 2018-11-06 DIAGNOSIS — M5432 Sciatica, left side: Secondary | ICD-10-CM | POA: Diagnosis not present

## 2018-11-06 DIAGNOSIS — M461 Sacroiliitis, not elsewhere classified: Secondary | ICD-10-CM | POA: Diagnosis not present

## 2018-11-06 DIAGNOSIS — M5431 Sciatica, right side: Secondary | ICD-10-CM | POA: Diagnosis not present

## 2018-11-06 DIAGNOSIS — M5416 Radiculopathy, lumbar region: Secondary | ICD-10-CM | POA: Diagnosis not present

## 2018-11-08 DIAGNOSIS — M461 Sacroiliitis, not elsewhere classified: Secondary | ICD-10-CM | POA: Diagnosis not present

## 2018-11-08 DIAGNOSIS — M5432 Sciatica, left side: Secondary | ICD-10-CM | POA: Diagnosis not present

## 2018-11-08 DIAGNOSIS — M5431 Sciatica, right side: Secondary | ICD-10-CM | POA: Diagnosis not present

## 2018-11-08 DIAGNOSIS — M5416 Radiculopathy, lumbar region: Secondary | ICD-10-CM | POA: Diagnosis not present

## 2018-11-27 DIAGNOSIS — E039 Hypothyroidism, unspecified: Secondary | ICD-10-CM | POA: Diagnosis not present

## 2018-11-27 DIAGNOSIS — E063 Autoimmune thyroiditis: Secondary | ICD-10-CM | POA: Diagnosis not present

## 2018-12-05 DIAGNOSIS — E041 Nontoxic single thyroid nodule: Secondary | ICD-10-CM | POA: Diagnosis not present

## 2018-12-05 DIAGNOSIS — K580 Irritable bowel syndrome with diarrhea: Secondary | ICD-10-CM | POA: Diagnosis not present

## 2018-12-05 DIAGNOSIS — Z79890 Hormone replacement therapy (postmenopausal): Secondary | ICD-10-CM | POA: Diagnosis not present

## 2018-12-05 DIAGNOSIS — R946 Abnormal results of thyroid function studies: Secondary | ICD-10-CM | POA: Diagnosis not present

## 2019-01-10 DIAGNOSIS — R946 Abnormal results of thyroid function studies: Secondary | ICD-10-CM | POA: Diagnosis not present

## 2019-01-10 DIAGNOSIS — Z79890 Hormone replacement therapy (postmenopausal): Secondary | ICD-10-CM | POA: Diagnosis not present

## 2019-01-16 DIAGNOSIS — E041 Nontoxic single thyroid nodule: Secondary | ICD-10-CM | POA: Diagnosis not present

## 2019-01-16 DIAGNOSIS — R946 Abnormal results of thyroid function studies: Secondary | ICD-10-CM | POA: Diagnosis not present

## 2019-01-16 DIAGNOSIS — Z79890 Hormone replacement therapy (postmenopausal): Secondary | ICD-10-CM | POA: Diagnosis not present

## 2019-01-16 DIAGNOSIS — K580 Irritable bowel syndrome with diarrhea: Secondary | ICD-10-CM | POA: Diagnosis not present

## 2019-02-21 ENCOUNTER — Telehealth: Payer: Self-pay | Admitting: *Deleted

## 2019-02-21 NOTE — Telephone Encounter (Signed)
"  I am calling to get some information about surgery for a Hammer Toe, the procedure, recovery period and what to expect.  I'm calling for my son who is in school in [place] in [place], [place].  He's hoping to obviously get a consultation in the next six weeks.  If he chooses to proceed with the surgery, he would like to do it between Thanksgiving and Christmas because they have such a long time off.  Please give me a call."

## 2019-02-22 NOTE — Telephone Encounter (Signed)
I am returning your call.  It's takes about four to six weeks to recover from [facility].  He will wear a surgical shoe for about two to three weeks.  Sutures will be removed two to three weeks after surgery.  He will be able to walk.  He will need to stay off his foot as much as possible the first couple of weeks.  He will receive a narcotics for the pain.  "Can we schedule him to have his surgery around the Thanksgiving / Christmas break?"  Yes we can but he needs to come in for an appointment for a consultation with a doctor.  "I scheduled him an appointment for July 13."  Sounds good, we'll get him schedule when he comes in.  What is his name?  "His name is [name]."

## 2019-02-26 DIAGNOSIS — J029 Acute pharyngitis, unspecified: Secondary | ICD-10-CM | POA: Diagnosis not present

## 2019-02-26 DIAGNOSIS — R05 Cough: Secondary | ICD-10-CM | POA: Diagnosis not present

## 2019-02-26 DIAGNOSIS — Z20828 Contact with and (suspected) exposure to other viral communicable diseases: Secondary | ICD-10-CM | POA: Diagnosis not present

## 2019-02-26 DIAGNOSIS — U071 COVID-19: Secondary | ICD-10-CM | POA: Diagnosis not present

## 2019-02-26 DIAGNOSIS — Z03818 Encounter for observation for suspected exposure to other biological agents ruled out: Secondary | ICD-10-CM | POA: Diagnosis not present

## 2019-03-05 DIAGNOSIS — R918 Other nonspecific abnormal finding of lung field: Secondary | ICD-10-CM | POA: Diagnosis not present

## 2019-03-05 DIAGNOSIS — U071 COVID-19: Secondary | ICD-10-CM | POA: Diagnosis not present

## 2019-03-05 DIAGNOSIS — R569 Unspecified convulsions: Secondary | ICD-10-CM | POA: Diagnosis not present

## 2019-03-05 DIAGNOSIS — R05 Cough: Secondary | ICD-10-CM | POA: Diagnosis not present

## 2019-03-05 DIAGNOSIS — Z209 Contact with and (suspected) exposure to unspecified communicable disease: Secondary | ICD-10-CM | POA: Diagnosis not present

## 2019-03-05 DIAGNOSIS — R55 Syncope and collapse: Secondary | ICD-10-CM | POA: Diagnosis not present

## 2019-03-08 DIAGNOSIS — J189 Pneumonia, unspecified organism: Secondary | ICD-10-CM | POA: Diagnosis not present

## 2019-03-08 DIAGNOSIS — R509 Fever, unspecified: Secondary | ICD-10-CM | POA: Diagnosis not present

## 2019-03-08 DIAGNOSIS — U071 COVID-19: Secondary | ICD-10-CM | POA: Diagnosis not present

## 2019-03-12 DIAGNOSIS — G4700 Insomnia, unspecified: Secondary | ICD-10-CM | POA: Diagnosis not present

## 2019-03-12 DIAGNOSIS — U071 COVID-19: Secondary | ICD-10-CM | POA: Diagnosis not present

## 2019-03-12 DIAGNOSIS — J309 Allergic rhinitis, unspecified: Secondary | ICD-10-CM | POA: Diagnosis not present

## 2019-04-17 DIAGNOSIS — K580 Irritable bowel syndrome with diarrhea: Secondary | ICD-10-CM | POA: Diagnosis not present

## 2019-04-17 DIAGNOSIS — E041 Nontoxic single thyroid nodule: Secondary | ICD-10-CM | POA: Diagnosis not present

## 2019-04-17 DIAGNOSIS — R946 Abnormal results of thyroid function studies: Secondary | ICD-10-CM | POA: Diagnosis not present

## 2019-04-17 DIAGNOSIS — Z79890 Hormone replacement therapy (postmenopausal): Secondary | ICD-10-CM | POA: Diagnosis not present

## 2019-04-24 DIAGNOSIS — Z79890 Hormone replacement therapy (postmenopausal): Secondary | ICD-10-CM | POA: Diagnosis not present

## 2019-04-24 DIAGNOSIS — E041 Nontoxic single thyroid nodule: Secondary | ICD-10-CM | POA: Diagnosis not present

## 2019-04-24 DIAGNOSIS — R946 Abnormal results of thyroid function studies: Secondary | ICD-10-CM | POA: Diagnosis not present

## 2019-07-27 ENCOUNTER — Other Ambulatory Visit: Payer: Self-pay | Admitting: Obstetrics and Gynecology

## 2019-07-27 DIAGNOSIS — Z1231 Encounter for screening mammogram for malignant neoplasm of breast: Secondary | ICD-10-CM

## 2019-09-19 ENCOUNTER — Other Ambulatory Visit: Payer: Self-pay

## 2019-09-19 ENCOUNTER — Other Ambulatory Visit: Payer: Self-pay | Admitting: Obstetrics and Gynecology

## 2019-09-19 ENCOUNTER — Ambulatory Visit
Admission: RE | Admit: 2019-09-19 | Discharge: 2019-09-19 | Disposition: A | Discharge status: Home / Self Care | Payer: BC Managed Care – PPO | Source: Ambulatory Visit | Attending: Obstetrics and Gynecology | Admitting: Obstetrics and Gynecology

## 2019-09-19 DIAGNOSIS — R928 Other abnormal and inconclusive findings on diagnostic imaging of breast: Secondary | ICD-10-CM

## 2019-09-19 DIAGNOSIS — Z1231 Encounter for screening mammogram for malignant neoplasm of breast: Secondary | ICD-10-CM | POA: Diagnosis not present

## 2019-09-20 DIAGNOSIS — R140 Abdominal distension (gaseous): Secondary | ICD-10-CM | POA: Diagnosis not present

## 2019-09-20 DIAGNOSIS — R1310 Dysphagia, unspecified: Secondary | ICD-10-CM | POA: Diagnosis not present

## 2019-09-20 DIAGNOSIS — Z1211 Encounter for screening for malignant neoplasm of colon: Secondary | ICD-10-CM | POA: Diagnosis not present

## 2019-09-28 ENCOUNTER — Ambulatory Visit
Admission: RE | Admit: 2019-09-28 | Discharge: 2019-09-28 | Disposition: A | Discharge status: Home / Self Care | Payer: BC Managed Care – PPO | Source: Ambulatory Visit | Attending: Obstetrics and Gynecology | Admitting: Obstetrics and Gynecology

## 2019-09-28 ENCOUNTER — Other Ambulatory Visit: Payer: Self-pay

## 2019-09-28 DIAGNOSIS — R928 Other abnormal and inconclusive findings on diagnostic imaging of breast: Secondary | ICD-10-CM

## 2019-09-28 DIAGNOSIS — R922 Inconclusive mammogram: Secondary | ICD-10-CM | POA: Diagnosis not present

## 2019-09-28 DIAGNOSIS — N6312 Unspecified lump in the right breast, upper inner quadrant: Secondary | ICD-10-CM | POA: Diagnosis not present

## 2019-11-19 DIAGNOSIS — Z1159 Encounter for screening for other viral diseases: Secondary | ICD-10-CM | POA: Diagnosis not present

## 2019-11-22 DIAGNOSIS — K644 Residual hemorrhoidal skin tags: Secondary | ICD-10-CM | POA: Diagnosis not present

## 2019-11-22 DIAGNOSIS — R1310 Dysphagia, unspecified: Secondary | ICD-10-CM | POA: Diagnosis not present

## 2019-11-22 DIAGNOSIS — K2930 Chronic superficial gastritis without bleeding: Secondary | ICD-10-CM | POA: Diagnosis not present

## 2019-11-22 DIAGNOSIS — Z1211 Encounter for screening for malignant neoplasm of colon: Secondary | ICD-10-CM | POA: Diagnosis not present

## 2019-11-22 DIAGNOSIS — R140 Abdominal distension (gaseous): Secondary | ICD-10-CM | POA: Diagnosis not present

## 2019-11-22 DIAGNOSIS — D125 Benign neoplasm of sigmoid colon: Secondary | ICD-10-CM | POA: Diagnosis not present

## 2019-11-22 DIAGNOSIS — D122 Benign neoplasm of ascending colon: Secondary | ICD-10-CM | POA: Diagnosis not present

## 2019-11-22 DIAGNOSIS — K219 Gastro-esophageal reflux disease without esophagitis: Secondary | ICD-10-CM | POA: Diagnosis not present

## 2019-11-22 DIAGNOSIS — D123 Benign neoplasm of transverse colon: Secondary | ICD-10-CM | POA: Diagnosis not present

## 2019-11-22 DIAGNOSIS — K228 Other specified diseases of esophagus: Secondary | ICD-10-CM | POA: Diagnosis not present

## 2019-11-22 DIAGNOSIS — K3189 Other diseases of stomach and duodenum: Secondary | ICD-10-CM | POA: Diagnosis not present

## 2019-11-22 DIAGNOSIS — K2980 Duodenitis without bleeding: Secondary | ICD-10-CM | POA: Diagnosis not present

## 2019-12-19 DIAGNOSIS — Z1211 Encounter for screening for malignant neoplasm of colon: Secondary | ICD-10-CM | POA: Diagnosis not present

## 2019-12-19 DIAGNOSIS — Z01419 Encounter for gynecological examination (general) (routine) without abnormal findings: Secondary | ICD-10-CM | POA: Diagnosis not present

## 2019-12-19 DIAGNOSIS — Z1231 Encounter for screening mammogram for malignant neoplasm of breast: Secondary | ICD-10-CM | POA: Diagnosis not present

## 2019-12-19 DIAGNOSIS — Z3040 Encounter for surveillance of contraceptives, unspecified: Secondary | ICD-10-CM | POA: Diagnosis not present

## 2020-01-16 DIAGNOSIS — E039 Hypothyroidism, unspecified: Secondary | ICD-10-CM | POA: Diagnosis not present

## 2020-01-16 DIAGNOSIS — Z0000 Encounter for general adult medical examination without abnormal findings: Secondary | ICD-10-CM | POA: Diagnosis not present

## 2020-01-23 DIAGNOSIS — R7302 Impaired glucose tolerance (oral): Secondary | ICD-10-CM | POA: Diagnosis not present

## 2020-01-23 DIAGNOSIS — Z0000 Encounter for general adult medical examination without abnormal findings: Secondary | ICD-10-CM | POA: Diagnosis not present

## 2020-01-23 DIAGNOSIS — E7800 Pure hypercholesterolemia, unspecified: Secondary | ICD-10-CM | POA: Diagnosis not present

## 2020-01-23 DIAGNOSIS — R319 Hematuria, unspecified: Secondary | ICD-10-CM | POA: Diagnosis not present

## 2020-01-23 DIAGNOSIS — R030 Elevated blood-pressure reading, without diagnosis of hypertension: Secondary | ICD-10-CM | POA: Diagnosis not present

## 2020-01-23 DIAGNOSIS — K2100 Gastro-esophageal reflux disease with esophagitis, without bleeding: Secondary | ICD-10-CM | POA: Diagnosis not present

## 2020-01-28 DIAGNOSIS — R030 Elevated blood-pressure reading, without diagnosis of hypertension: Secondary | ICD-10-CM | POA: Diagnosis not present

## 2020-02-07 DIAGNOSIS — R3129 Other microscopic hematuria: Secondary | ICD-10-CM | POA: Diagnosis not present

## 2020-02-11 DIAGNOSIS — R319 Hematuria, unspecified: Secondary | ICD-10-CM | POA: Diagnosis not present

## 2020-02-25 ENCOUNTER — Ambulatory Visit
Admission: RE | Admit: 2020-02-25 | Discharge: 2020-02-25 | Disposition: A | Discharge status: Home / Self Care | Payer: BC Managed Care – PPO | Source: Ambulatory Visit | Attending: Family Medicine | Admitting: Family Medicine

## 2020-02-25 ENCOUNTER — Other Ambulatory Visit: Payer: Self-pay

## 2020-02-25 ENCOUNTER — Other Ambulatory Visit: Payer: Self-pay | Admitting: Family Medicine

## 2020-02-25 DIAGNOSIS — R0602 Shortness of breath: Secondary | ICD-10-CM | POA: Diagnosis not present

## 2020-02-25 DIAGNOSIS — J90 Pleural effusion, not elsewhere classified: Secondary | ICD-10-CM | POA: Diagnosis not present

## 2020-02-25 DIAGNOSIS — J984 Other disorders of lung: Secondary | ICD-10-CM | POA: Diagnosis not present

## 2020-02-25 DIAGNOSIS — J939 Pneumothorax, unspecified: Secondary | ICD-10-CM | POA: Diagnosis not present

## 2020-02-28 DIAGNOSIS — E7800 Pure hypercholesterolemia, unspecified: Secondary | ICD-10-CM | POA: Diagnosis not present

## 2020-04-04 DIAGNOSIS — R3121 Asymptomatic microscopic hematuria: Secondary | ICD-10-CM | POA: Diagnosis not present

## 2020-04-04 DIAGNOSIS — R8279 Other abnormal findings on microbiological examination of urine: Secondary | ICD-10-CM | POA: Diagnosis not present

## 2020-04-23 DIAGNOSIS — R3121 Asymptomatic microscopic hematuria: Secondary | ICD-10-CM | POA: Diagnosis not present

## 2020-04-29 DIAGNOSIS — Z23 Encounter for immunization: Secondary | ICD-10-CM | POA: Diagnosis not present

## 2020-04-29 DIAGNOSIS — R7302 Impaired glucose tolerance (oral): Secondary | ICD-10-CM | POA: Diagnosis not present

## 2020-05-09 DIAGNOSIS — R3121 Asymptomatic microscopic hematuria: Secondary | ICD-10-CM | POA: Diagnosis not present

## 2020-05-22 DIAGNOSIS — E7800 Pure hypercholesterolemia, unspecified: Secondary | ICD-10-CM | POA: Diagnosis not present

## 2020-06-26 ENCOUNTER — Encounter: Payer: Self-pay | Admitting: Pulmonary Disease

## 2020-06-26 ENCOUNTER — Other Ambulatory Visit: Payer: Self-pay

## 2020-06-26 ENCOUNTER — Ambulatory Visit (INDEPENDENT_AMBULATORY_CARE_PROVIDER_SITE_OTHER): Payer: BC Managed Care – PPO | Admitting: Pulmonary Disease

## 2020-06-26 DIAGNOSIS — R0609 Other forms of dyspnea: Secondary | ICD-10-CM

## 2020-06-26 DIAGNOSIS — R0600 Dyspnea, unspecified: Secondary | ICD-10-CM | POA: Diagnosis not present

## 2020-06-26 NOTE — Patient Instructions (Signed)
No further testing required from a breathing standpoint. If shortness of breath gets worse, call us back and we can schedule breathing test

## 2020-06-26 NOTE — Progress Notes (Signed)
Subjective:    Patient ID: [name], female    DOB: 25-Nov-1960, 59 y.o.   MRN: [id number]  HPI  Chief Complaint  Patient presents with  . Consult    Pt is being referred due to dyspnea. Pt stated that she was diagnosed with Covid 02/2019. Pt states SOB could happen at any time. Denies any complaints of cough or wheezing.    59 year old never smoker referred for evaluation of persistent dyspnea following Covid infection in June/2020. She reports that she had Covid infection 02/2019, she was sick for about 14 days with a cough but did not have oxygen desaturation or required hospitalization.  She went to her doctor's office and had a syncopal episode while doing a chest x-ray on day 14 for which she required ED observation.  Chest x-ray was reported clear. Since then she has had dyspnea which is very episodic, although mostly occurs on exertion can also occur at rest.  This is not improved over the past few months.  She was given prednisone for 2 weeks and may by her PCP.  She also received an albuterol MDI in June but denies any benefit from this. She denies cough, wheezing.  She has no childhood history of asthma .  She does have some nonspecific symptoms of fatigue. Microscopic hematuria was evaluated by urologist no abnormality found. She reports occasional dysphagia.  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  Since her Covid diagnosis she was also diagnosed with diabetes and placed on Metformin and hypertension she is seeing a nutritionist  On ambulation 3 laps around the office today, oxygen saturation stayed at 97% and heart rate increased from 83-100  Labs from PCP were reviewed which show normal TFTs, normal hemoglobin and electrolytes  She smoked about a pack per week until she quit in 2010, less than 10 pack years. She lives with her husband and 2 kids aged 78 and 72.  She is a social drinker  No past medical history on file.  Not on File  No past surgical history on  file.  Social History   Socioeconomic History  . Marital status: Married    Spouse name: Not on file  . Number of children: Not on file  . Years of education: Not on file  . Highest education level: Not on file  Occupational History  . Not on file  Tobacco Use  . Smoking status: Never Smoker  . Smokeless tobacco: Never Used  Substance and Sexual Activity  . Alcohol use: No  . Drug use: No  . Sexual activity: Never  Other Topics Concern  . Not on file  Social History Narrative  . Not on file   Social Determinants of Health   Financial Resource Strain:   . Difficulty of Paying Living Expenses: Not on file  Food Insecurity:   . Worried About Programme researcher, broadcasting/film/video in the Last Year: Not on file  . Ran Out of Food in the Last Year: Not on file  Transportation Needs:   . Lack of Transportation (Medical): Not on file  . Lack of Transportation (Non-Medical): Not on file  Physical Activity:   . Days of Exercise per Week: Not on file  . Minutes of Exercise per Session: Not on file  Stress:   . Feeling of Stress : Not on file  Social Connections:   . Frequency of Communication with Friends and Family: Not on file  . Frequency of Social Gatherings with Friends and Family:  Not on file  . Attends Religious Services: Not on file  . Active Member of Clubs or Organizations: Not on file  . Attends Banker Meetings: Not on file  . Marital Status: Not on file  Intimate Partner Violence:   . Fear of Current or Ex-Partner: Not on file  . Emotionally Abused: Not on file  . Physically Abused: Not on file  . Sexually Abused: Not on file    No family history on file.  Review of Systems Shortness of breath with activity/at rest Weight gain Acid heartburn Difficulty swallowing Occasional feet swelling Joint stiffness Anxiety and insomnia    Objective:   Physical Exam  Gen. Pleasant, well-nourished, in no distress, normal affect ENT - no pallor,icterus, no post nasal  drip Neck: No JVD, no thyromegaly, no carotid bruits Lungs: no use of accessory muscles, no dullness to percussion, clear without rales or rhonchi  Cardiovascular: Rhythm regular, heart sounds  normal, no murmurs or gallops, no peripheral edema Abdomen: soft and non-tender, no hepatosplenomegaly, BS normal. Musculoskeletal: No deformities, no cyanosis or clubbing Neuro:  alert, non focal       Assessment & Plan:

## 2020-06-26 NOTE — Assessment & Plan Note (Signed)
Her dyspnea appears to be episodic and seems to occur on exertion as well as at rest.  On ambulation her oxygen saturation does not drop and her heart rate did not increase tremendously to indicate deconditioning.  Chest x-ray 02/2020 which I reviewed it appears clear of infiltrates and does not show any effusions.  Exam is normal. There is no reason to suspect pulmonary hypertension, no ILD is demonstrated.  As such I doubt pulmonary cause for her shortness of breath .  Do not think more pulmonary testing is indicated at this time.  If symptoms continue or if she develops wheezing, we can certainly perform PFTs. I congratulated her on her attempts at nutritional counseling and weight loss.

## 2020-07-22 DIAGNOSIS — E7800 Pure hypercholesterolemia, unspecified: Secondary | ICD-10-CM | POA: Diagnosis not present

## 2020-07-22 DIAGNOSIS — I10 Essential (primary) hypertension: Secondary | ICD-10-CM | POA: Diagnosis not present

## 2020-07-22 DIAGNOSIS — R7302 Impaired glucose tolerance (oral): Secondary | ICD-10-CM | POA: Diagnosis not present

## 2020-07-22 DIAGNOSIS — U071 COVID-19: Secondary | ICD-10-CM | POA: Diagnosis not present

## 2020-08-11 DIAGNOSIS — E7800 Pure hypercholesterolemia, unspecified: Secondary | ICD-10-CM | POA: Diagnosis not present

## 2020-11-06 ENCOUNTER — Other Ambulatory Visit: Payer: Self-pay | Admitting: Obstetrics and Gynecology

## 2020-11-06 DIAGNOSIS — Z1231 Encounter for screening mammogram for malignant neoplasm of breast: Secondary | ICD-10-CM

## 2020-12-05 ENCOUNTER — Other Ambulatory Visit: Payer: Self-pay | Admitting: Family Medicine

## 2020-12-05 DIAGNOSIS — R1310 Dysphagia, unspecified: Secondary | ICD-10-CM

## 2020-12-17 ENCOUNTER — Ambulatory Visit
Admission: RE | Admit: 2020-12-17 | Discharge: 2020-12-17 | Disposition: A | Discharge status: Home / Self Care | Payer: BC Managed Care – PPO | Source: Ambulatory Visit | Attending: Family Medicine | Admitting: Family Medicine

## 2020-12-17 DIAGNOSIS — R1310 Dysphagia, unspecified: Secondary | ICD-10-CM

## 2020-12-31 ENCOUNTER — Other Ambulatory Visit: Payer: Self-pay

## 2020-12-31 ENCOUNTER — Encounter: Payer: Self-pay | Admitting: Podiatry

## 2020-12-31 ENCOUNTER — Ambulatory Visit: Payer: BC Managed Care – PPO | Admitting: Podiatry

## 2020-12-31 DIAGNOSIS — B351 Tinea unguium: Secondary | ICD-10-CM | POA: Diagnosis not present

## 2020-12-31 MED ORDER — TERBINAFINE HCL 250 MG PO TABS: ORAL_TABLET | ORAL | 0 refills | Status: AC

## 2020-12-31 NOTE — Progress Notes (Signed)
Subjective:   Patient ID: [name], female   DOB: 60 y.o.   MRN: [id number]   HPI Patient presents stating that she has some discoloration on the nailbeds hallux bilateral and she is concerned about fungus and states she is only noticed it over the last few months.  She has concerns about this as far as what this could mean for the future.  Patient does not smoke likes to be active   Review of Systems  All other systems reviewed and are negative.       Objective:  Physical Exam Vitals and nursing note reviewed.  Constitutional:      Appearance: She is well-developed.  Pulmonary:     Effort: Pulmonary effort is normal.  Musculoskeletal:        General: Normal range of motion.  Skin:    General: Skin is warm.  Neurological:     Mental Status: She is alert.     Neurovascular status found to be intact muscle strength found to be adequate range of motion adequate.  Patient is noted to have discoloration of the lateral side of the nailbeds bilateral localized to this area with no proximal spread noted and no other nail pathology noted.  Good digital perfusion well oriented     Assessment:  Possibility of trauma to the nailbeds versus fungus versus a combination of the 2     Plan:  H&P reviewed condition and we will start her on a pulse Lamisil along with laser treatment to the affected nail bed with consideration of topical and can use polish in between treatments.  If symptoms persist may consider other treatment and I advised her and spent a great deal of time going over the different treatment options available for her condition and educating her on the difference between trauma and true fungal infiltration

## 2021-01-01 ENCOUNTER — Ambulatory Visit: Payer: BC Managed Care – PPO

## 2021-01-05 ENCOUNTER — Other Ambulatory Visit: Payer: Self-pay

## 2021-01-05 ENCOUNTER — Ambulatory Visit (INDEPENDENT_AMBULATORY_CARE_PROVIDER_SITE_OTHER): Payer: BC Managed Care – PPO

## 2021-01-05 DIAGNOSIS — B351 Tinea unguium: Secondary | ICD-10-CM

## 2021-01-05 NOTE — Progress Notes (Signed)
Patient presents today for the 1st laser treatment. Diagnosed with mycotic nail infection by Dr. Charlsie Merles.   Toenail most affected bilateral hallux.  All other systems are negative.  Nails were filed thin. Laser therapy was administered to bilateral hallux toenails  and patient tolerated the treatment well. All safety precautions were in place.   Patient is currently taking Lamisil as well.  Follow up in 6 weeks for laser # 2.

## 2021-01-05 NOTE — Patient Instructions (Signed)

## 2021-02-20 ENCOUNTER — Other Ambulatory Visit: Payer: BC Managed Care – PPO

## 2021-04-03 ENCOUNTER — Other Ambulatory Visit: Payer: BC Managed Care – PPO

## 2021-05-15 ENCOUNTER — Other Ambulatory Visit: Payer: BC Managed Care – PPO

## 2021-07-20 ENCOUNTER — Ambulatory Visit: Payer: BC Managed Care – PPO

## 2021-10-01 ENCOUNTER — Ambulatory Visit
Admission: RE | Admit: 2021-10-01 | Discharge: 2021-10-01 | Disposition: A | Discharge status: Home / Self Care | Payer: BC Managed Care – PPO | Source: Ambulatory Visit | Attending: Obstetrics and Gynecology | Admitting: Obstetrics and Gynecology

## 2021-10-01 DIAGNOSIS — Z1231 Encounter for screening mammogram for malignant neoplasm of breast: Secondary | ICD-10-CM

## 2022-04-08 IMAGING — CR DG CHEST 2V
2 series · 2 of 2 positions shown · non-contrast
Comparison: None.

CLINICAL DATA: Shortness of breath.

EXAM:
CHEST - 2 VIEW

[w chest pa]
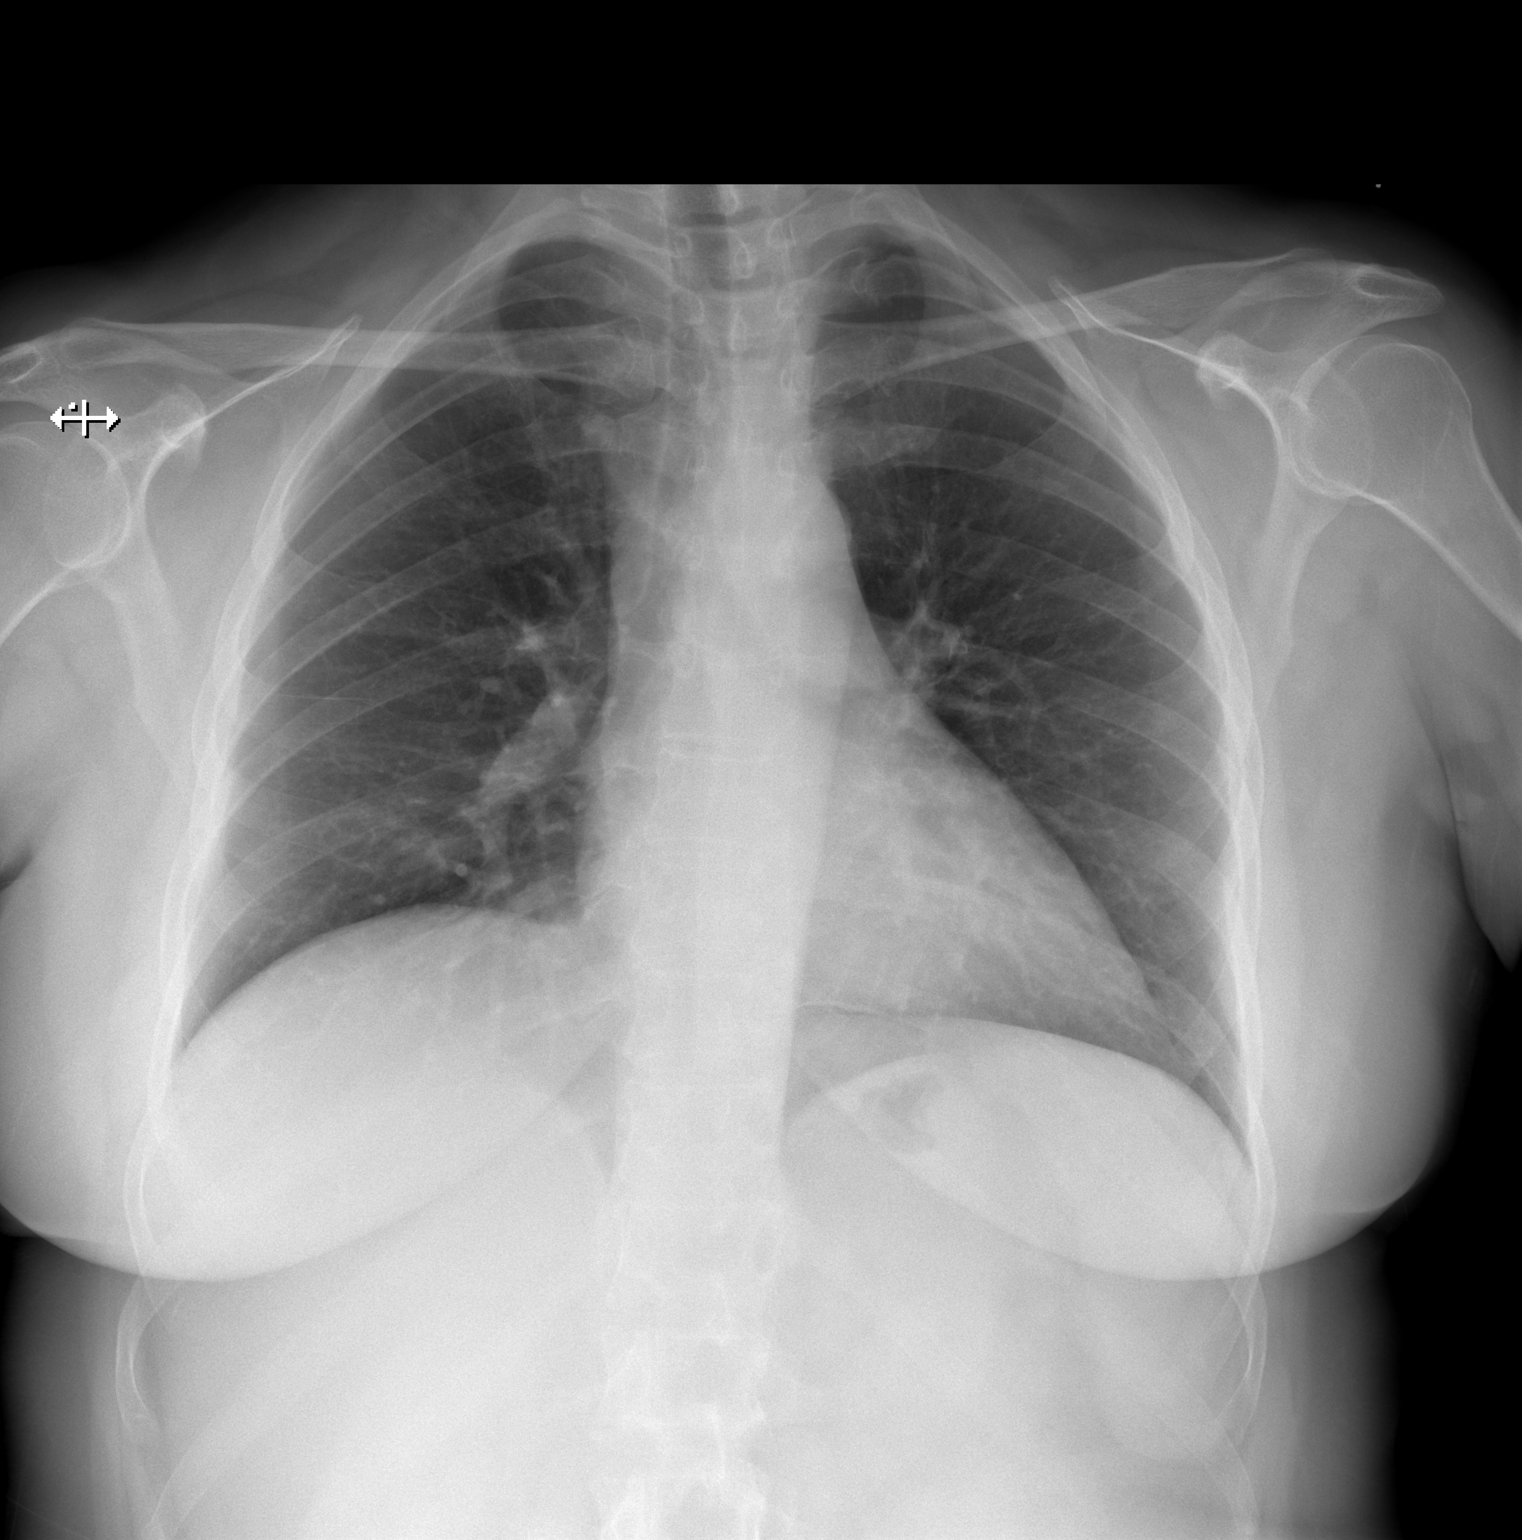

[w chest lat]
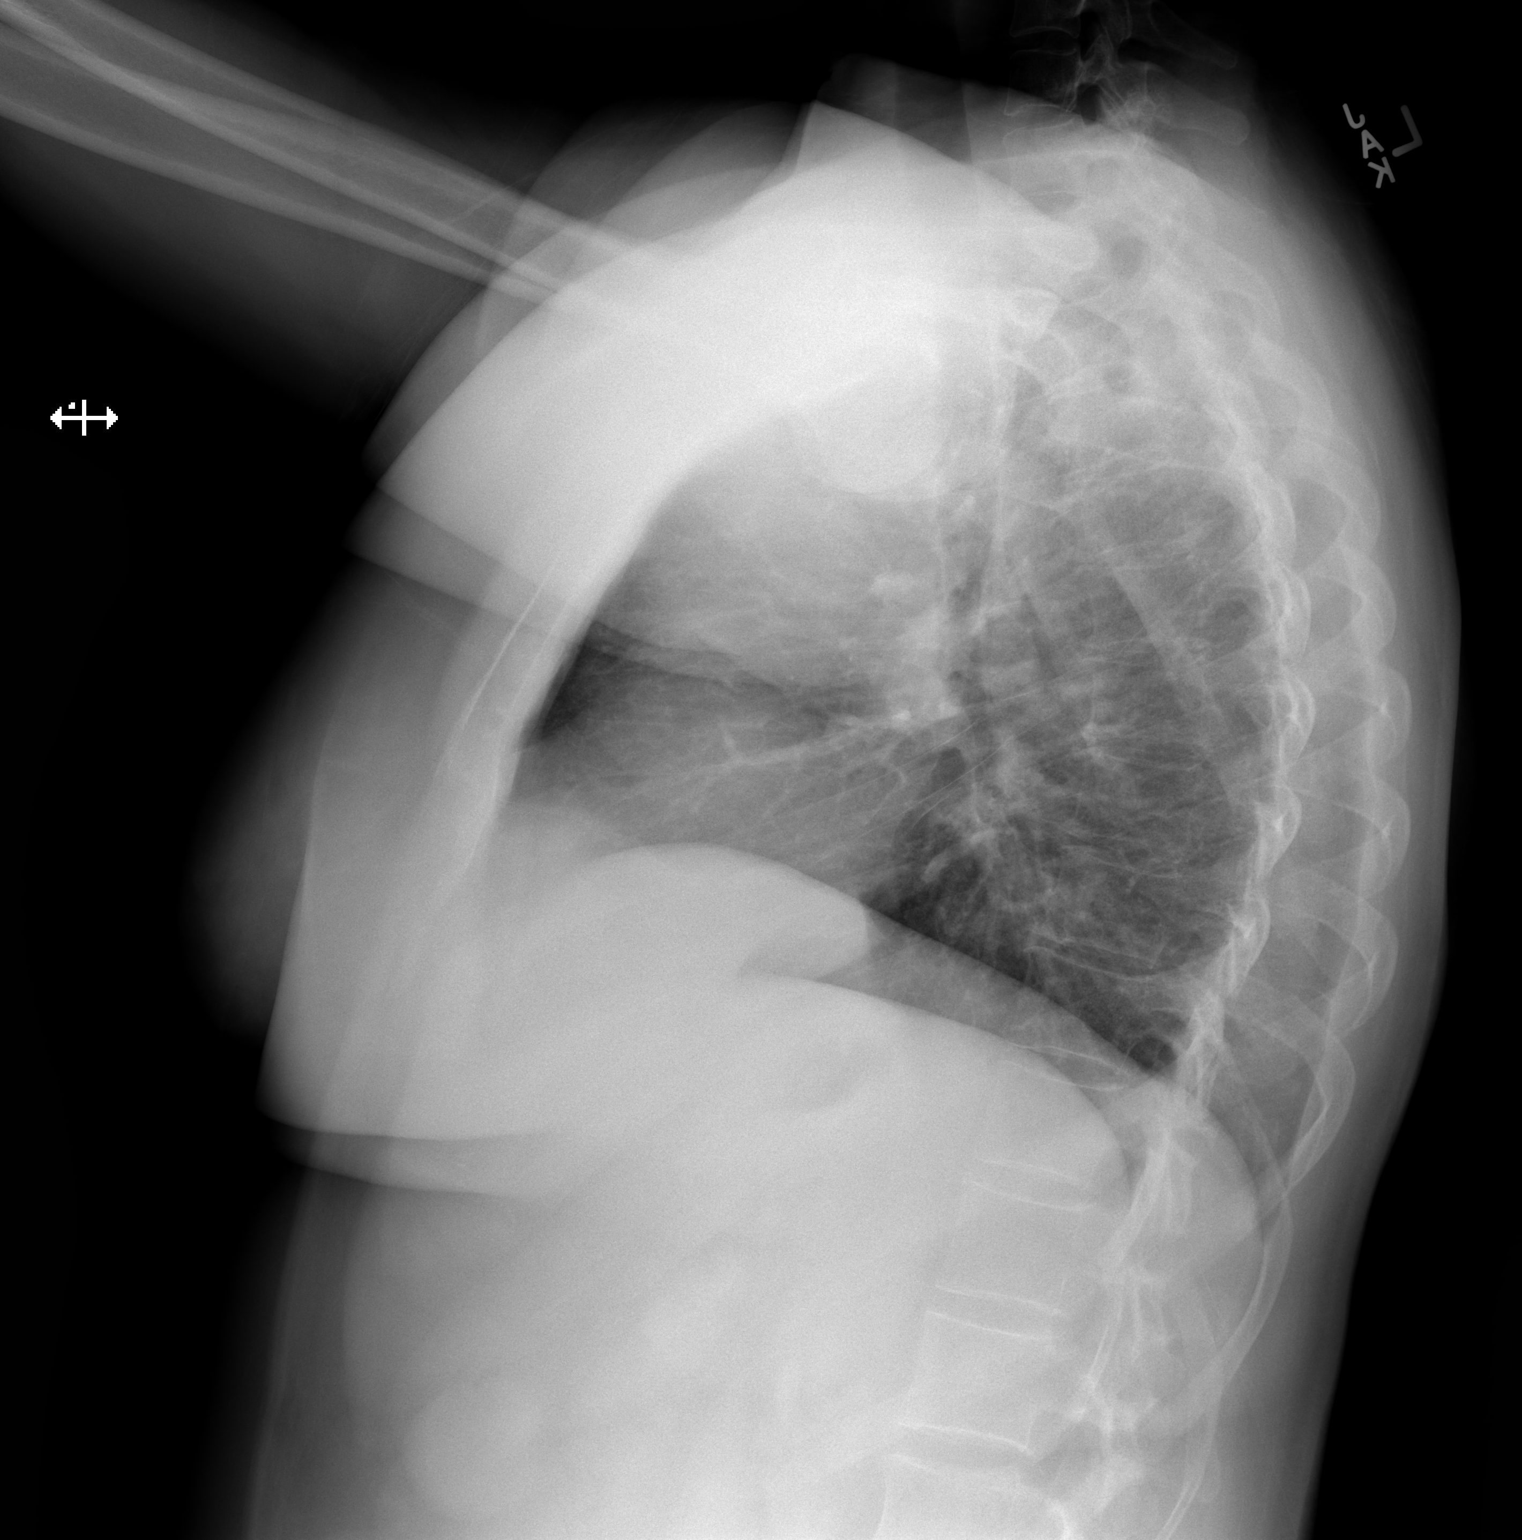

[2 of 2 positions shown; findings below may reference images not displayed]

FINDINGS: The cardiomediastinal silhouette is within normal limits. The lungs
are slightly hypoinflated. No airspace consolidation, edema, pleural
effusion, pneumothorax is identified. No acute osseous abnormality
is seen.
IMPRESSION: No active cardiopulmonary disease.

## 2023-02-10 ENCOUNTER — Other Ambulatory Visit: Payer: Self-pay | Admitting: Obstetrics and Gynecology

## 2023-02-10 DIAGNOSIS — Z1231 Encounter for screening mammogram for malignant neoplasm of breast: Secondary | ICD-10-CM

## 2023-03-07 ENCOUNTER — Ambulatory Visit
Admission: RE | Admit: 2023-03-07 | Discharge: 2023-03-07 | Disposition: A | Discharge status: Home / Self Care | Payer: BC Managed Care – PPO | Source: Ambulatory Visit | Attending: Obstetrics and Gynecology | Admitting: Obstetrics and Gynecology

## 2023-03-07 DIAGNOSIS — Z1231 Encounter for screening mammogram for malignant neoplasm of breast: Secondary | ICD-10-CM

## 2023-03-11 ENCOUNTER — Other Ambulatory Visit: Payer: Self-pay | Admitting: Obstetrics and Gynecology

## 2023-03-11 DIAGNOSIS — R928 Other abnormal and inconclusive findings on diagnostic imaging of breast: Secondary | ICD-10-CM

## 2023-03-17 ENCOUNTER — Ambulatory Visit
Admission: RE | Admit: 2023-03-17 | Discharge: 2023-03-17 | Disposition: A | Discharge status: Home / Self Care | Payer: BC Managed Care – PPO | Source: Ambulatory Visit | Attending: Obstetrics and Gynecology | Admitting: Obstetrics and Gynecology

## 2023-03-17 ENCOUNTER — Other Ambulatory Visit: Payer: Self-pay | Admitting: Obstetrics and Gynecology

## 2023-03-17 DIAGNOSIS — N6310 Unspecified lump in the right breast, unspecified quadrant: Secondary | ICD-10-CM

## 2023-03-17 DIAGNOSIS — R928 Other abnormal and inconclusive findings on diagnostic imaging of breast: Secondary | ICD-10-CM

## 2023-03-17 DIAGNOSIS — R599 Enlarged lymph nodes, unspecified: Secondary | ICD-10-CM

## 2023-03-23 ENCOUNTER — Ambulatory Visit
Admission: RE | Admit: 2023-03-23 | Discharge: 2023-03-23 | Disposition: A | Discharge status: Home / Self Care | Payer: BC Managed Care – PPO | Source: Ambulatory Visit | Attending: Obstetrics and Gynecology | Admitting: Obstetrics and Gynecology

## 2023-03-23 DIAGNOSIS — R599 Enlarged lymph nodes, unspecified: Secondary | ICD-10-CM

## 2023-03-23 DIAGNOSIS — N6310 Unspecified lump in the right breast, unspecified quadrant: Secondary | ICD-10-CM

## 2023-03-23 HISTORY — PX: BREAST BIOPSY: SHX20

## 2023-05-20 ENCOUNTER — Other Ambulatory Visit: Payer: Self-pay | Admitting: *Deleted

## 2023-05-20 ENCOUNTER — Encounter: Payer: Self-pay | Admitting: *Deleted

## 2023-05-20 DIAGNOSIS — C50411 Malignant neoplasm of upper-outer quadrant of right female breast: Secondary | ICD-10-CM | POA: Insufficient documentation

## 2023-05-20 DIAGNOSIS — C50919 Malignant neoplasm of unspecified site of unspecified female breast: Secondary | ICD-10-CM

## 2023-05-20 NOTE — Progress Notes (Signed)
This patient was scheduled to see Dr Myna Hidalgo next week for transfer of care. She was recently diagnosed with Stage III,(cT2, cN2, Mx) Right sided ER+/PR+/HER2 invasive breast cancer. She sought initial staging, care, and first treatment with [facility]. She received her first cycle 05/19/2023.  She was a friend of one of our patients and wished to transfer her subsequent care and treatment to this office. However she used the dignicap with her first cycle and wishes to continue use. This location does not have this device and therefor she will need to transfer her care to the [facility] location.   Referral order placed. Message sent to breast navigators. They will get the patient scheduled.   Oncology Nurse Navigator Documentation     05/20/2023    8:00 AM  Oncology Nurse Navigator Flowsheets  Navigator Location [facility]  Navigator Encounter Type Telephone  Telephone Incoming Call  Barriers/Navigation Needs Coordination of Care  Interventions Referrals  Referrals Medical Oncology  Time Spent with Patient 30

## 2023-05-23 ENCOUNTER — Telehealth: Payer: Self-pay | Admitting: Hematology and Oncology

## 2023-05-24 ENCOUNTER — Encounter: Payer: Self-pay | Admitting: Hematology and Oncology

## 2023-05-24 ENCOUNTER — Other Ambulatory Visit: Payer: Self-pay | Admitting: Hematology and Oncology

## 2023-05-24 DIAGNOSIS — Z170 Estrogen receptor positive status [ER+]: Secondary | ICD-10-CM

## 2023-05-24 DIAGNOSIS — C50411 Malignant neoplasm of upper-outer quadrant of right female breast: Secondary | ICD-10-CM

## 2023-05-24 NOTE — Progress Notes (Signed)
START ON PATHWAY REGIMEN - Breast     Cycles 1 through 4: A cycle is every 14 days:     Doxorubicin      Cyclophosphamide      [id number]-[id number]    Cycles 5 through 16: A cycle is every 7 days:     Paclitaxel   **Always confirm dose/schedule in your pharmacy ordering system**  Patient Characteristics: Preoperative or Nonsurgical Candidate, M0 (Clinical Staging), Up to cT4c, Any N, M0, Neoadjuvant Therapy followed by Surgery, Invasive Disease, Chemotherapy, HER2 Negative, ER Positive Therapeutic Status: Preoperative or Nonsurgical Candidate, M0 (Clinical Staging) AJCC M Category: cM0 AJCC Grade: G3 ER Status: Positive (+) AJCC 8 Stage Grouping: IIIA HER2 Status: Negative (-) AJCC T Category: cT2 AJCC N Category: cN2a PR Status: Positive (+) Breast Surgical Plan: Neoadjuvant Therapy followed by Surgery Intent of Therapy: Curative Intent, Discussed with Patient

## 2023-05-25 ENCOUNTER — Other Ambulatory Visit: Payer: Self-pay | Admitting: *Deleted

## 2023-05-25 ENCOUNTER — Encounter: Payer: Self-pay | Admitting: General Practice

## 2023-05-25 ENCOUNTER — Inpatient Hospital Stay (HOSPITAL_BASED_OUTPATIENT_CLINIC_OR_DEPARTMENT_OTHER): Payer: BC Managed Care – PPO | Admitting: Hematology and Oncology

## 2023-05-25 ENCOUNTER — Telehealth: Payer: Self-pay | Admitting: Hematology and Oncology

## 2023-05-25 ENCOUNTER — Inpatient Hospital Stay: Payer: BC Managed Care – PPO | Attending: Hematology and Oncology

## 2023-05-25 ENCOUNTER — Other Ambulatory Visit: Payer: Self-pay

## 2023-05-25 VITALS — BP 158/89 | HR 98 | Temp 97.9°F | Resp 18 | Ht 68.5 in | Wt 186.5 lb

## 2023-05-25 DIAGNOSIS — Z5111 Encounter for antineoplastic chemotherapy: Secondary | ICD-10-CM | POA: Diagnosis present

## 2023-05-25 DIAGNOSIS — C50411 Malignant neoplasm of upper-outer quadrant of right female breast: Secondary | ICD-10-CM | POA: Insufficient documentation

## 2023-05-25 DIAGNOSIS — Z170 Estrogen receptor positive status [ER+]: Secondary | ICD-10-CM

## 2023-05-25 DIAGNOSIS — Z79899 Other long term (current) drug therapy: Secondary | ICD-10-CM | POA: Diagnosis not present

## 2023-05-25 LAB — CMP (CANCER CENTER ONLY)
ALT: 12 U/L (ref 0–44)
AST: 8 U/L — ABNORMAL LOW (ref 15–41)
Albumin: 4 g/dL (ref 3.5–5.0)
Alkaline Phosphatase: 106 U/L (ref 38–126)
Anion gap: 5 (ref 5–15)
BUN: 17 mg/dL (ref 6–20)
CO2: 27 mmol/L (ref 22–32)
Calcium: 9.7 mg/dL (ref 8.9–10.3)
Chloride: 105 mmol/L (ref 98–111)
Creatinine: 0.72 mg/dL (ref 0.44–1.00)
GFR, Estimated: >60 mL/min
Glucose, Bld: 170 mg/dL — ABNORMAL HIGH (ref 70–99)
Potassium: 4 mmol/L (ref 3.5–5.1)
Sodium: 137 mmol/L (ref 135–145)
Total Bilirubin: 0.3 mg/dL (ref 0.3–1.2)
Total Protein: 6.8 g/dL (ref 6.5–8.1)

## 2023-05-25 LAB — CBC WITH DIFFERENTIAL (CANCER CENTER ONLY)
Abs Immature Granulocytes: 0.04 10*3/uL (ref 0.00–0.07)
Basophils Absolute: 0 10*3/uL (ref 0.0–0.1)
Basophils Relative: 1 %
Eosinophils Absolute: 0.2 10*3/uL (ref 0.0–0.5)
Eosinophils Relative: 4 %
HCT: 37.8 % (ref 36.0–46.0)
Hemoglobin: 13.2 g/dL (ref 12.0–15.0)
Immature Granulocytes: 1 %
Lymphocytes Relative: 29 %
Lymphs Abs: 1.3 10*3/uL (ref 0.7–4.0)
MCH: 30.8 pg (ref 26.0–34.0)
MCHC: 34.9 g/dL (ref 30.0–36.0)
MCV: 88.1 fL (ref 80.0–100.0)
Monocytes Absolute: 0.2 10*3/uL (ref 0.1–1.0)
Monocytes Relative: 4 %
Neutro Abs: 2.7 10*3/uL (ref 1.7–7.7)
Neutrophils Relative %: 61 %
Platelet Count: 251 10*3/uL (ref 150–400)
RBC: 4.29 MIL/uL (ref 3.87–5.11)
RDW: 12.3 % (ref 11.5–15.5)
Smear Review: NORMAL
WBC Count: 4.4 10*3/uL (ref 4.0–10.5)
nRBC: 0 % (ref 0.0–0.2)

## 2023-05-25 NOTE — Telephone Encounter (Signed)
Left patient a message regarding scheduled appointment timesd/dates

## 2023-05-25 NOTE — Progress Notes (Signed)
[facility] CONSULT NOTE  Patient Care Team: Ermalinda Memos, MD as PCP - General (Internal Medicine) Donnelly Angelica, RN as Oncology Nurse Navigator Pershing Proud, RN as Oncology Nurse Navigator Serena Croissant, MD as Consulting Physician (Hematology and Oncology)  CHIEF COMPLAINTS/PURPOSE OF CONSULTATION:  Newly diagnosed breast cancer  HISTORY OF PRESENTING ILLNESS:  Discussed the use of AI scribe software for clinical note transcription with the patient, who gave verbal consent to proceed.  History of Present Illness   The patient, recently diagnosed with estrogen positive, progesterone positive, HER2 negative, grade three breast cancer, presents with anxiety and difficulty sleeping. The patient reports feeling anxious and emotional, with a constant anticipation of feeling nauseous or vomiting, although she has not experienced these symptoms yet. The patient has been taking lorazepam to help manage her anxiety, which she reports has been helpful. The patient also reports feeling unwell and unstable since starting chemotherapy, which has limited her physical activity. The patient's spouse is present and supportive during the consultation.       I reviewed her records extensively and collaborated the history with the patient.  SUMMARY OF ONCOLOGIC HISTORY: Oncology History  Malignant neoplasm of upper-outer quadrant of right breast in female, estrogen receptor positive (HCC)  05/20/2023 Initial Diagnosis   Mammogram detected right breast mass 2.2 cm with 3 enlarged lymph nodes (biopsy positive) biopsy of the mass revealed grade 3 IDC ER 100% PR 30% HER2 negative Ki-67 40%, genetics negative, breast MRI revealed 1.8 cm mass with non-mass enhancement measuring 2.8 cm and 7 lymph nodes with enlarged internal mammary lymph node, left breast numerous enhancing foci (?  Not biopsied) 2 enhancing sternal lesions lesion in the liver 1.6 cm (PET negative)   05/24/2023 -  Chemotherapy    Patient is on Treatment Plan : BREAST ADJUVANT DOSE DENSE AC q14d / PACLitaxel q7d     05/25/2023 Cancer Staging   Staging form: Breast, AJCC 8th Edition - Clinical: Stage IIIA (cT2, cN2a, cM0, G3, ER+, PR+, HER2-) - Signed by Serena Croissant, MD on 05/25/2023 Stage prefix: Initial diagnosis Histologic grading system: 3 grade system      MEDICAL HISTORY:  No previous medical problems  SURGICAL HISTORY: Past Surgical History:  Procedure Laterality Date   BREAST BIOPSY Right 03/23/2023   [place] RT BREAST BX W LOC DEV 1ST LESION IMG BX SPEC US GUIDE 03/23/2023 [facility] MAMMOGRAPHY    SOCIAL HISTORY: Social History   Socioeconomic History   Marital status: Married    Spouse name: Not on file   Number of children: Not on file   Years of education: Not on file   Highest education level: Not on file  Occupational History   Not on file  Tobacco Use   Smoking status: Never   Smokeless tobacco: Never  Substance and Sexual Activity   Alcohol use: No   Drug use: No   Sexual activity: Never  Other Topics Concern   Not on file  Social History Narrative   Not on file   Social Determinants of Health   Financial Resource Strain: Not on file  Food Insecurity: Not on file  Transportation Needs: Not on file  Physical Activity: Not on file  Stress: Not on file  Social Connections: Unknown (04/01/2023)   [facility]   Social Network    Social Network: Not on file  Intimate Partner Violence: Low Risk  (05/19/2023)   Received from [facility]   [facility] Historical Interpersonal  Safety    Does anyone neglect, hurt, or threaten the patient?: No    FAMILY HISTORY: No family history on file.  ALLERGIES:  has no allergies on file.  MEDICATIONS:  Current Outpatient Medications  Medication Sig Dispense Refill   ALPRAZolam (XANAX) 0.5 MG tablet Take 0.5 mg by mouth daily as needed.     fluticasone (FLONASE) 50 MCG/ACT nasal spray Place 2 sprays into  both nostrils daily. 16 g 12   Guaifenesin (MUCINEX MAXIMUM STRENGTH) 1200 MG TB12 Take 1 tablet (1,200 mg total) by mouth every 12 (twelve) hours as needed. 14 tablet 1   metFORMIN (GLUCOPHAGE-XR) 500 MG 24 hr tablet Take 500 mg by mouth 2 (two) times daily.     pantoprazole (PROTONIX) 40 MG tablet Take 40 mg by mouth daily.     rosuvastatin (CRESTOR) 10 MG tablet Take 10 mg by mouth at bedtime.     spironolactone (ALDACTONE) 25 MG tablet spironolactone 25 mg tablet     terbinafine (LAMISIL) 250 MG tablet Please take one a day x 7days, repeat every 4 weeks x 4 months 28 tablet 0   No current facility-administered medications for this visit.    REVIEW OF SYSTEMS:   Constitutional: Denies fevers, chills or abnormal night sweats   All other systems were reviewed with the patient and are negative.  PHYSICAL EXAMINATION: ECOG PERFORMANCE STATUS: 1 - Symptomatic but completely ambulatory  Vitals:   05/25/23 1507  BP: (!) 158/89  Pulse: 98  Resp: 18  Temp: 97.9 F (36.6 C)  SpO2: 100%   Filed Weights   05/25/23 1507  Weight: 186 lb 8 oz (84.6 kg)    GENERAL:alert, no distress and comfortable    LABORATORY DATA:  I have reviewed the data as listed Lab Results  Component Value Date   WBC 4.4 05/25/2023   HGB 13.2 05/25/2023   HCT 37.8 05/25/2023   MCV 88.1 05/25/2023   PLT 251 05/25/2023   Lab Results  Component Value Date   NA 137 05/25/2023   K 4.0 05/25/2023   CL 105 05/25/2023   CO2 27 05/25/2023    RADIOGRAPHIC STUDIES: I have personally reviewed the radiological reports and agreed with the findings in the report.  ASSESSMENT AND PLAN:  Malignant neoplasm of upper-outer quadrant of right breast in female, estrogen receptor positive (HCC) Mammogram detected right breast mass 2.2 cm with 3 enlarged lymph nodes (biopsy positive) biopsy of the mass revealed grade 3 IDC ER 100% PR 30% HER2 negative Ki-67 40%, genetics negative, breast MRI revealed 1.8 cm mass with  non-mass enhancement measuring 2.8 cm and 7 lymph nodes with enlarged internal mammary lymph node, left breast numerous enhancing foci (?  Not biopsied) 2 enhancing sternal lesions lesion in the liver 1.6 cm (PET negative)  Treatment plan: Neoadjuvant chemotherapy with dose dense Adriamycin and Cytoxan x 4 followed by Taxol weekly x 12 started at [facility]: Today is cycle 2 Adriamycin and Cytoxan Bilateral Mastectomies with ALND (therefore left breast lesions have not been biopsied) Adjuvant radiation therapy Adjuvant antiestrogen therapy with CDK inhibitors  Chemo toxicities: Echocardiogram 05/18/2023: EF 65%  Liver MRI was recommended to evaluate the liver lesions.  I would like to obtain that for further evaluation.  Return to clinic in 1 week to start cycle 2 Adriamycin and Cytoxan. On day 3 she will get Neulasta injection and finder cc of normal saline. Assessment and Plan    Emotional Distress Patient reports anxiety, difficulty sleeping, and emotional distress related  to diagnosis and treatment. -Encourage use of Ativan as needed for anxiety and sleep. -Consider referral to therapist or chaplain for additional emotional support.        All questions were answered. The patient knows to call the clinic with any problems, questions or concerns.    Tamsen Meek, MD 05/25/23

## 2023-05-25 NOTE — Progress Notes (Signed)
[facility] Spiritual Care Note  Referred by nurse navigator for introduction to [facility] and support services/programming. Met Iridian and her husband Michele Mcalpine in person to provide information and emotional support, normalizing feelings, describing check-in procedure for treatment (because Tammye had her first chemo treatment at [facility], but is picking up here at [facility] with her second), and describing [facility] support resources and [employer].  Couple was very appreciative of information and Spiritual Care availability; Aleiah plans to call as needed for follow-up support.   [address] [place], [place], [facility] Pager [phone] Voicemail [phone]

## 2023-05-25 NOTE — Assessment & Plan Note (Addendum)
Mammogram detected right breast mass 2.2 cm with 3 enlarged lymph nodes (biopsy positive) biopsy of the mass revealed grade 3 IDC ER 100% PR 30% HER2 negative Ki-67 40%, genetics negative, breast MRI revealed 1.8 cm mass with non-mass enhancement measuring 2.8 cm and 7 lymph nodes with enlarged internal mammary lymph node, left breast numerous enhancing foci (?  Not biopsied) 2 enhancing sternal lesions lesion in the liver 1.6 cm (PET negative)  Treatment plan: Neoadjuvant chemotherapy with dose dense Adriamycin and Cytoxan x 4 followed by Taxol weekly x 12 started at [facility]: Today is cycle 2 Adriamycin and Cytoxan Bilateral Mastectomies with ALND (therefore left breast lesions have not been biopsied) Adjuvant radiation therapy Adjuvant antiestrogen therapy with CDK inhibitors  Chemo toxicities: Echocardiogram 05/18/2023: EF 65%  Liver MRI was recommended to evaluate the liver lesions.  I would like to obtain that for further evaluation.  Return to clinic in 1 week to start cycle 2 Adriamycin and Cytoxan. On day 3 she will get Neulasta injection and finder cc of normal saline.

## 2023-05-26 ENCOUNTER — Encounter: Payer: Self-pay | Admitting: *Deleted

## 2023-05-26 ENCOUNTER — Other Ambulatory Visit: Payer: Self-pay

## 2023-05-26 ENCOUNTER — Other Ambulatory Visit: Payer: BC Managed Care – PPO

## 2023-05-26 ENCOUNTER — Ambulatory Visit: Payer: BC Managed Care – PPO | Admitting: Hematology & Oncology

## 2023-05-28 ENCOUNTER — Other Ambulatory Visit: Payer: Self-pay

## 2023-05-30 ENCOUNTER — Encounter: Payer: Self-pay | Admitting: *Deleted

## 2023-06-01 MED FILL — Dexamethasone Sodium Phosphate Inj 100 MG/10ML: INTRAMUSCULAR | Qty: 1 | Status: AC

## 2023-06-01 MED FILL — Fosaprepitant Dimeglumine For IV Infusion 150 MG (Base Eq): INTRAVENOUS | Qty: 5 | Status: AC

## 2023-06-02 ENCOUNTER — Inpatient Hospital Stay: Payer: BC Managed Care – PPO

## 2023-06-02 VITALS — BP 152/78 | HR 88 | Temp 98.2°F | Resp 18 | Wt 185.2 lb

## 2023-06-02 DIAGNOSIS — C50411 Malignant neoplasm of upper-outer quadrant of right female breast: Secondary | ICD-10-CM

## 2023-06-02 DIAGNOSIS — Z5111 Encounter for antineoplastic chemotherapy: Secondary | ICD-10-CM | POA: Diagnosis not present

## 2023-06-02 LAB — CBC WITH DIFFERENTIAL (CANCER CENTER ONLY)
Abs Immature Granulocytes: 0.58 10*3/uL — ABNORMAL HIGH (ref 0.00–0.07)
Basophils Absolute: 0.1 10*3/uL (ref 0.0–0.1)
Basophils Relative: 1 %
Eosinophils Absolute: 0.1 10*3/uL (ref 0.0–0.5)
Eosinophils Relative: 1 %
HCT: 36 % (ref 36.0–46.0)
Hemoglobin: 12.4 g/dL (ref 12.0–15.0)
Immature Granulocytes: 6 %
Lymphocytes Relative: 28 %
Lymphs Abs: 2.7 10*3/uL (ref 0.7–4.0)
MCH: 30.8 pg (ref 26.0–34.0)
MCHC: 34.4 g/dL (ref 30.0–36.0)
MCV: 89.6 fL (ref 80.0–100.0)
Monocytes Absolute: 0.5 10*3/uL (ref 0.1–1.0)
Monocytes Relative: 5 %
Neutro Abs: 5.7 10*3/uL (ref 1.7–7.7)
Neutrophils Relative %: 59 %
Platelet Count: 266 10*3/uL (ref 150–400)
RBC: 4.02 MIL/uL (ref 3.87–5.11)
RDW: 12.9 % (ref 11.5–15.5)
Smear Review: NORMAL
WBC Count: 9.6 10*3/uL (ref 4.0–10.5)
nRBC: 0 % (ref 0.0–0.2)

## 2023-06-02 LAB — CMP (CANCER CENTER ONLY)
ALT: 22 U/L (ref 0–44)
AST: 12 U/L — ABNORMAL LOW (ref 15–41)
Albumin: 3.9 g/dL (ref 3.5–5.0)
Alkaline Phosphatase: 74 U/L (ref 38–126)
Anion gap: 8 (ref 5–15)
BUN: 12 mg/dL (ref 6–20)
CO2: 25 mmol/L (ref 22–32)
Calcium: 9.6 mg/dL (ref 8.9–10.3)
Chloride: 106 mmol/L (ref 98–111)
Creatinine: 0.84 mg/dL (ref 0.44–1.00)
GFR, Estimated: >60 mL/min (ref >60)
Glucose, Bld: 136 mg/dL — ABNORMAL HIGH (ref 70–99)
Potassium: 3.9 mmol/L (ref 3.5–5.1)
Sodium: 139 mmol/L (ref 135–145)
Total Bilirubin: 0.3 mg/dL (ref 0.3–1.2)
Total Protein: 6.6 g/dL (ref 6.5–8.1)

## 2023-06-02 MED ORDER — DOXORUBICIN HCL CHEMO IV INJECTION 2 MG/ML
60.0000 mg/m2 | Freq: Once | INTRAVENOUS | Status: AC
  Administered 2023-06-02: 126 mg via INTRAVENOUS
  Filled 2023-06-02: qty 63

## 2023-06-02 MED ORDER — SODIUM CHLORIDE 0.9 % IV SOLN
150.0000 mg | Freq: Once | INTRAVENOUS | Status: AC
  Administered 2023-06-02: 150 mg via INTRAVENOUS
  Filled 2023-06-02: qty 150

## 2023-06-02 MED ORDER — SODIUM CHLORIDE 0.9 % IV SOLN
10.0000 mg | Freq: Once | INTRAVENOUS | Status: AC
  Administered 2023-06-02: 10 mg via INTRAVENOUS
  Filled 2023-06-02: qty 10

## 2023-06-02 MED ORDER — PALONOSETRON HCL INJECTION 0.25 MG/5ML
0.2500 mg | Freq: Once | INTRAVENOUS | Status: AC
  Administered 2023-06-02: 0.25 mg via INTRAVENOUS
  Filled 2023-06-02: qty 5

## 2023-06-02 MED ORDER — SODIUM CHLORIDE 0.9 % IV SOLN: Freq: Once | INTRAVENOUS | Status: AC

## 2023-06-02 MED ORDER — SODIUM CHLORIDE 0.9 % IV SOLN
600.0000 mg/m2 | Freq: Once | INTRAVENOUS | Status: AC
  Administered 2023-06-02: 1260 mg via INTRAVENOUS
  Filled 2023-06-02: qty 63

## 2023-06-02 NOTE — Patient Instructions (Signed)
[place] [facility]  Discharge Instructions: Thank you for choosing [facility] to provide your oncology and hematology care.   If you have a lab appointment with the Cancer Center, please go directly to the Cancer Center and check in at the registration area.   Wear comfortable clothing and clothing appropriate for easy access to any Portacath or PICC line.   We strive to give you quality time with your provider. You may need to reschedule your appointment if you arrive late (15 or more minutes).  Arriving late affects you and other patients whose appointments are after yours.  Also, if you miss three or more appointments without notifying the office, you may be dismissed from the clinic at the provider's discretion.      For prescription refill requests, have your pharmacy contact our office and allow 72 hours for refills to be completed.    Today you received the following chemotherapy and/or immunotherapy agents adriamycin, cytoxan      To help prevent nausea and vomiting after your treatment, we encourage you to take your nausea medication as directed.  BELOW ARE SYMPTOMS THAT SHOULD BE REPORTED IMMEDIATELY: *FEVER GREATER THAN 100.4 F (38 C) OR HIGHER *CHILLS OR SWEATING *NAUSEA AND VOMITING THAT IS NOT CONTROLLED WITH YOUR NAUSEA MEDICATION *UNUSUAL SHORTNESS OF BREATH *UNUSUAL BRUISING OR BLEEDING *URINARY PROBLEMS (pain or burning when urinating, or frequent urination) *BOWEL PROBLEMS (unusual diarrhea, constipation, pain near the anus) TENDERNESS IN MOUTH AND THROAT WITH OR WITHOUT PRESENCE OF ULCERS (sore throat, sores in mouth, or a toothache) UNUSUAL RASH, SWELLING OR PAIN  UNUSUAL VAGINAL DISCHARGE OR ITCHING   Items with * indicate a potential emergency and should be followed up as soon as possible or go to the Emergency Department if any problems should occur.  Please show the CHEMOTHERAPY ALERT CARD or IMMUNOTHERAPY ALERT CARD  at check-in to the Emergency Department and triage nurse.  Should you have questions after your visit or need to cancel or reschedule your appointment, please contact [place] [facility]  Dept: [phone]  and follow the prompts.  Office hours are 8:00 a.m. to 4:30 p.m. Monday - Friday. Please note that voicemails left after 4:00 p.m. may not be returned until the following business day.  We are closed weekends and major holidays. You have access to a nurse at all times for urgent questions. Please call the main number to the clinic Dept: [phone] and follow the prompts.   For any non-urgent questions, you may also contact your provider using MyChart. We now offer e-Visits for anyone 67 and older to request care online for non-urgent symptoms. For details visit [email or web address].   Also download the MyChart app! Go to the app store, search "MyChart", open the app, select [place], and log in with your MyChart username and password.

## 2023-06-04 ENCOUNTER — Inpatient Hospital Stay: Payer: BC Managed Care – PPO

## 2023-06-04 VITALS — BP 155/83 | HR 94 | Temp 97.9°F | Resp 16

## 2023-06-04 DIAGNOSIS — Z170 Estrogen receptor positive status [ER+]: Secondary | ICD-10-CM

## 2023-06-04 DIAGNOSIS — Z5111 Encounter for antineoplastic chemotherapy: Secondary | ICD-10-CM | POA: Diagnosis not present

## 2023-06-04 MED ORDER — PEGFILGRASTIM-JMDB 6 MG/0.6ML ~~LOC~~ SOSY: 6.0000 mg | PREFILLED_SYRINGE | Freq: Once | SUBCUTANEOUS | Status: DC

## 2023-06-04 MED ORDER — PEGFILGRASTIM-JMDB 6 MG/0.6ML ~~LOC~~ SOSY
6.0000 mg | PREFILLED_SYRINGE | Freq: Once | SUBCUTANEOUS | Status: AC
  Administered 2023-06-04: 6 mg via SUBCUTANEOUS

## 2023-06-04 NOTE — Progress Notes (Signed)
[name] received her Fulphila injection but declined to receive IV fluids today. She stated that she would be open to receive in two weeks.

## 2023-06-07 ENCOUNTER — Other Ambulatory Visit: Payer: BC Managed Care – PPO

## 2023-06-15 MED FILL — Dexamethasone Sodium Phosphate Inj 100 MG/10ML: INTRAMUSCULAR | Qty: 1 | Status: AC

## 2023-06-15 MED FILL — Fosaprepitant Dimeglumine For IV Infusion 150 MG (Base Eq): INTRAVENOUS | Qty: 5 | Status: AC

## 2023-06-16 ENCOUNTER — Inpatient Hospital Stay: Payer: BC Managed Care – PPO

## 2023-06-16 ENCOUNTER — Other Ambulatory Visit: Payer: Self-pay

## 2023-06-16 ENCOUNTER — Inpatient Hospital Stay: Payer: BC Managed Care – PPO | Attending: Hematology and Oncology | Admitting: Hematology and Oncology

## 2023-06-16 VITALS — BP 157/81 | HR 81 | Temp 97.7°F | Resp 18 | Ht 68.5 in | Wt 185.0 lb

## 2023-06-16 DIAGNOSIS — Z5111 Encounter for antineoplastic chemotherapy: Secondary | ICD-10-CM | POA: Diagnosis present

## 2023-06-16 DIAGNOSIS — Z170 Estrogen receptor positive status [ER+]: Secondary | ICD-10-CM

## 2023-06-16 DIAGNOSIS — Z7689 Persons encountering health services in other specified circumstances: Secondary | ICD-10-CM | POA: Diagnosis not present

## 2023-06-16 DIAGNOSIS — F419 Anxiety disorder, unspecified: Secondary | ICD-10-CM | POA: Diagnosis not present

## 2023-06-16 DIAGNOSIS — Z79899 Other long term (current) drug therapy: Secondary | ICD-10-CM | POA: Insufficient documentation

## 2023-06-16 DIAGNOSIS — C50411 Malignant neoplasm of upper-outer quadrant of right female breast: Secondary | ICD-10-CM | POA: Insufficient documentation

## 2023-06-16 DIAGNOSIS — R932 Abnormal findings on diagnostic imaging of liver and biliary tract: Secondary | ICD-10-CM | POA: Insufficient documentation

## 2023-06-16 DIAGNOSIS — R0609 Other forms of dyspnea: Secondary | ICD-10-CM

## 2023-06-16 LAB — CBC WITH DIFFERENTIAL (CANCER CENTER ONLY)
Abs Immature Granulocytes: 1.13 10*3/uL — ABNORMAL HIGH (ref 0.00–0.07)
Basophils Absolute: 0.2 10*3/uL — ABNORMAL HIGH (ref 0.0–0.1)
Basophils Relative: 1 %
Eosinophils Absolute: 0.1 10*3/uL (ref 0.0–0.5)
Eosinophils Relative: 1 %
HCT: 36.3 % (ref 36.0–46.0)
Hemoglobin: 12.3 g/dL (ref 12.0–15.0)
Immature Granulocytes: 6 %
Lymphocytes Relative: 9 %
Lymphs Abs: 1.6 10*3/uL (ref 0.7–4.0)
MCH: 30.4 pg (ref 26.0–34.0)
MCHC: 33.9 g/dL (ref 30.0–36.0)
MCV: 89.9 fL (ref 80.0–100.0)
Monocytes Absolute: 0.7 10*3/uL (ref 0.1–1.0)
Monocytes Relative: 4 %
Neutro Abs: 14.6 10*3/uL — ABNORMAL HIGH (ref 1.7–7.7)
Neutrophils Relative %: 79 %
Platelet Count: 187 10*3/uL (ref 150–400)
RBC: 4.04 MIL/uL (ref 3.87–5.11)
RDW: 13.2 % (ref 11.5–15.5)
WBC Count: 18.3 10*3/uL — ABNORMAL HIGH (ref 4.0–10.5)
nRBC: 0.2 % (ref 0.0–0.2)

## 2023-06-16 LAB — CMP (CANCER CENTER ONLY)
ALT: 18 U/L (ref 0–44)
AST: 11 U/L — ABNORMAL LOW (ref 15–41)
Albumin: 4.2 g/dL (ref 3.5–5.0)
Alkaline Phosphatase: 120 U/L (ref 38–126)
Anion gap: 8 (ref 5–15)
BUN: 11 mg/dL (ref 6–20)
CO2: 25 mmol/L (ref 22–32)
Calcium: 9.8 mg/dL (ref 8.9–10.3)
Chloride: 106 mmol/L (ref 98–111)
Creatinine: 0.74 mg/dL (ref 0.44–1.00)
GFR, Estimated: >60 mL/min (ref >60)
Glucose, Bld: 139 mg/dL — ABNORMAL HIGH (ref 70–99)
Potassium: 4 mmol/L (ref 3.5–5.1)
Sodium: 139 mmol/L (ref 135–145)
Total Bilirubin: 0.3 mg/dL (ref 0.3–1.2)
Total Protein: 6.9 g/dL (ref 6.5–8.1)

## 2023-06-16 MED ORDER — SODIUM CHLORIDE 0.9 % IV SOLN
600.0000 mg/m2 | Freq: Once | INTRAVENOUS | Status: AC
  Administered 2023-06-16: 1260 mg via INTRAVENOUS
  Filled 2023-06-16: qty 63

## 2023-06-16 MED ORDER — HEPARIN SOD (PORK) LOCK FLUSH 100 UNIT/ML IV SOLN: 500.0000 [IU] | Freq: Once | INTRAVENOUS | Status: DC | PRN

## 2023-06-16 MED ORDER — SODIUM CHLORIDE 0.9% FLUSH: 10.0000 mL | INTRAVENOUS | Status: DC | PRN

## 2023-06-16 MED ORDER — SODIUM CHLORIDE 0.9 % IV SOLN: Freq: Once | INTRAVENOUS | Status: AC

## 2023-06-16 MED ORDER — SODIUM CHLORIDE 0.9 % IV SOLN
150.0000 mg | Freq: Once | INTRAVENOUS | Status: AC
  Administered 2023-06-16: 150 mg via INTRAVENOUS
  Filled 2023-06-16: qty 150

## 2023-06-16 MED ORDER — SODIUM CHLORIDE 0.9% FLUSH
10.0000 mL | Freq: Once | INTRAVENOUS | Status: AC | PRN
  Administered 2023-06-16: 10 mL

## 2023-06-16 MED ORDER — PEGFILGRASTIM-CBQV (INF DEV) 6 MG/0.6ML ~~LOC~~ SOSY
6.0000 mg | PREFILLED_SYRINGE | Freq: Once | SUBCUTANEOUS | Status: AC
  Administered 2023-06-16: 6 mg via SUBCUTANEOUS
  Filled 2023-06-16: qty 0.6

## 2023-06-16 MED ORDER — PALONOSETRON HCL INJECTION 0.25 MG/5ML
0.2500 mg | Freq: Once | INTRAVENOUS | Status: AC
  Administered 2023-06-16: 0.25 mg via INTRAVENOUS
  Filled 2023-06-16: qty 5

## 2023-06-16 MED ORDER — SODIUM CHLORIDE 0.9 % IV SOLN
10.0000 mg | Freq: Once | INTRAVENOUS | Status: AC
  Administered 2023-06-16: 10 mg via INTRAVENOUS
  Filled 2023-06-16: qty 10

## 2023-06-16 MED ORDER — DOXORUBICIN HCL CHEMO IV INJECTION 2 MG/ML
60.0000 mg/m2 | Freq: Once | INTRAVENOUS | Status: AC
  Administered 2023-06-16: 126 mg via INTRAVENOUS
  Filled 2023-06-16: qty 63

## 2023-06-16 NOTE — Patient Instructions (Signed)
[place] [facility]  Discharge Instructions: Thank you for choosing [facility] to provide your oncology and hematology care.   If you have a lab appointment with the Cancer Center, please go directly to the Cancer Center and check in at the registration area.   Wear comfortable clothing and clothing appropriate for easy access to any Portacath or PICC line.   We strive to give you quality time with your provider. You may need to reschedule your appointment if you arrive late (15 or more minutes).  Arriving late affects you and other patients whose appointments are after yours.  Also, if you miss three or more appointments without notifying the office, you may be dismissed from the clinic at the provider's discretion.      For prescription refill requests, have your pharmacy contact our office and allow 72 hours for refills to be completed.    Today you received the following chemotherapy and/or immunotherapy agents: Doxorubicin, Cytoxan.       To help prevent nausea and vomiting after your treatment, we encourage you to take your nausea medication as directed.  BELOW ARE SYMPTOMS THAT SHOULD BE REPORTED IMMEDIATELY: *FEVER GREATER THAN 100.4 F (38 C) OR HIGHER *CHILLS OR SWEATING *NAUSEA AND VOMITING THAT IS NOT CONTROLLED WITH YOUR NAUSEA MEDICATION *UNUSUAL SHORTNESS OF BREATH *UNUSUAL BRUISING OR BLEEDING *URINARY PROBLEMS (pain or burning when urinating, or frequent urination) *BOWEL PROBLEMS (unusual diarrhea, constipation, pain near the anus) TENDERNESS IN MOUTH AND THROAT WITH OR WITHOUT PRESENCE OF ULCERS (sore throat, sores in mouth, or a toothache) UNUSUAL RASH, SWELLING OR PAIN  UNUSUAL VAGINAL DISCHARGE OR ITCHING   Items with * indicate a potential emergency and should be followed up as soon as possible or go to the Emergency Department if any problems should occur.  Please show the CHEMOTHERAPY ALERT CARD or IMMUNOTHERAPY ALERT  CARD at check-in to the Emergency Department and triage nurse.  Should you have questions after your visit or need to cancel or reschedule your appointment, please contact [place] [facility]  Dept: [phone]  and follow the prompts.  Office hours are 8:00 a.m. to 4:30 p.m. Monday - Friday. Please note that voicemails left after 4:00 p.m. may not be returned until the following business day.  We are closed weekends and major holidays. You have access to a nurse at all times for urgent questions. Please call the main number to the clinic Dept: [phone] and follow the prompts.   For any non-urgent questions, you may also contact your provider using MyChart. We now offer e-Visits for anyone 18 and older to request care online for non-urgent symptoms. For details visit [email or web address].   Also download the MyChart app! Go to the app store, search "MyChart", open the app, select [place], and log in with your MyChart username and password.   

## 2023-06-16 NOTE — Progress Notes (Signed)
Patient Care Team: Ermalinda Memos, MD as PCP - General (Internal Medicine) Donnelly Angelica, RN as Oncology Nurse Navigator Pershing Proud, RN as Oncology Nurse Navigator Serena Croissant, MD as Consulting Physician (Hematology and Oncology)  DIAGNOSIS:  Encounter Diagnosis  Name Primary?   Malignant neoplasm of upper-outer quadrant of right breast in female, estrogen receptor positive (HCC) Yes    SUMMARY OF ONCOLOGIC HISTORY: Oncology History  Malignant neoplasm of upper-outer quadrant of right breast in female, estrogen receptor positive (HCC)  05/20/2023 Initial Diagnosis   Mammogram detected right breast mass 2.2 cm with 3 enlarged lymph nodes (biopsy positive) biopsy of the mass revealed grade 3 IDC ER 100% PR 30% HER2 negative Ki-67 40%, genetics negative, breast MRI revealed 1.8 cm mass with non-mass enhancement measuring 2.8 cm and 7 lymph nodes with enlarged internal mammary lymph node, left breast numerous enhancing foci (?  Not biopsied) 2 enhancing sternal lesions lesion in the liver 1.6 cm (PET negative)   05/24/2023 -  Chemotherapy   Patient is on Treatment Plan : BREAST ADJUVANT DOSE DENSE AC q14d / PACLitaxel q7d     05/25/2023 Cancer Staging   Staging form: Breast, AJCC 8th Edition - Clinical: Stage IIIA (cT2, cN2a, cM0, G3, ER+, PR+, HER2-) - Signed by Serena Croissant, MD on 05/25/2023 Stage prefix: Initial diagnosis Histologic grading system: 3 grade system     CHIEF COMPLIANT: Cycle 3 dose dense Adriamycin and Cytoxan    History of Present Illness   The patient, currently undergoing chemotherapy with Dose dense Adriamycin and Cytoxan, reports a persistent feeling of unwellness since the last treatment. She describes a baseline feeling of nausea and hot flashes, which she manages by consuming cold food. The patient denies vomiting and constipation. She also reports fatigue, which has been progressively worsening with each treatment cycle.     ALLERGIES:  has No  Known Allergies.  MEDICATIONS:  Current Outpatient Medications  Medication Sig Dispense Refill   ALPRAZolam (XANAX) 0.5 MG tablet Take 0.5 mg by mouth daily as needed.     fluticasone (FLONASE) 50 MCG/ACT nasal spray Place 2 sprays into both nostrils daily. 16 g 12   Guaifenesin (MUCINEX MAXIMUM STRENGTH) 1200 MG TB12 Take 1 tablet (1,200 mg total) by mouth every 12 (twelve) hours as needed. 14 tablet 1   metFORMIN (GLUCOPHAGE-XR) 500 MG 24 hr tablet Take 500 mg by mouth 2 (two) times daily.     pantoprazole (PROTONIX) 40 MG tablet Take 40 mg by mouth daily.     rosuvastatin (CRESTOR) 10 MG tablet Take 10 mg by mouth at bedtime.     spironolactone (ALDACTONE) 25 MG tablet spironolactone 25 mg tablet     terbinafine (LAMISIL) 250 MG tablet Please take one a day x 7days, repeat every 4 weeks x 4 months 28 tablet 0   No current facility-administered medications for this visit.    PHYSICAL EXAMINATION: ECOG PERFORMANCE STATUS: 1 - Symptomatic but completely ambulatory  Vitals:   06/16/23 0800  BP: (!) 157/81  Pulse: 81  Resp: 18  Temp: 97.7 F (36.5 C)  SpO2: 99%   Filed Weights   06/16/23 0800  Weight: 185 lb (83.9 kg)     LABORATORY DATA:  I have reviewed the data as listed    Latest Ref Rng & Units 06/02/2023    7:44 AM 05/25/2023    2:55 PM  CMP  Glucose 70 - 99 mg/dL 742  595   BUN 6 - 20 mg/dL 12  17   Creatinine 0.44 - 1.00 mg/dL 1.61  0.96   Sodium 045 - 145 mmol/L 139  137   Potassium 3.5 - 5.1 mmol/L 3.9  4.0   Chloride 98 - 111 mmol/L 106  105   CO2 22 - 32 mmol/L 25  27   Calcium 8.9 - 10.3 mg/dL 9.6  9.7   Total Protein 6.5 - 8.1 g/dL 6.6  6.8   Total Bilirubin 0.3 - 1.2 mg/dL 0.3  0.3   Alkaline Phos 38 - 126 U/L 74  106   AST 15 - 41 U/L 12  8   ALT 0 - 44 U/L 22  12     Lab Results  Component Value Date   WBC 18.3 (H) 06/16/2023   HGB 12.3 06/16/2023   HCT 36.3 06/16/2023   MCV 89.9 06/16/2023   PLT 187 06/16/2023   NEUTROABS 14.6 (H)  06/16/2023    ASSESSMENT & PLAN:  Malignant neoplasm of upper-outer quadrant of right breast in female, estrogen receptor positive (HCC) Mammogram detected right breast mass 2.2 cm with 3 enlarged lymph nodes (biopsy positive) biopsy of the mass revealed grade 3 IDC ER 100% PR 30% HER2 negative Ki-67 40%, genetics negative, breast MRI revealed 1.8 cm mass with non-mass enhancement measuring 2.8 cm and 7 lymph nodes with enlarged internal mammary lymph node, left breast numerous enhancing foci (?  Not biopsied) 2 enhancing sternal lesions lesion in the liver 1.6 cm (PET negative)   Treatment plan: Neoadjuvant chemotherapy with dose dense Adriamycin and Cytoxan x 4 followed by Taxol weekly x 12 started at [facility]: We are continuing her treatment here Bilateral Mastectomies with ALND (therefore left breast lesions have not been biopsied) Adjuvant radiation therapy Adjuvant antiestrogen therapy with CDK inhibitors  ------------------------------------------------------------------------------------------------------------------------------------------------- Current treatment: Cycle 3 dose dense Adriamycin and Cytoxan  Chemo toxicities: Fatigue Bone pain Alopecia  Anxiety and emotional distress: Uses Ativan for anxiety and sleep Echocardiogram 05/18/2023: EF 65%   Liver MRI was recommended to evaluate the liver lesions scheduled for 06/23/2023  We will try and see if Neulasta on body injector can be given instead of the Neulasta shot.  She did much better with the Neulasta on body injector.   No orders of the defined types were placed in this encounter.  The patient has a good understanding of the overall plan. she agrees with it. she will call with any problems that may develop before the next visit here. Total time spent: 30 mins including face to face time and time spent for planning, charting and co-ordination of care   Tamsen Meek, MD 06/16/23

## 2023-06-16 NOTE — Assessment & Plan Note (Addendum)
Mammogram detected right breast mass 2.2 cm with 3 enlarged lymph nodes (biopsy positive) biopsy of the mass revealed grade 3 IDC ER 100% PR 30% HER2 negative Ki-67 40%, genetics negative, breast MRI revealed 1.8 cm mass with non-mass enhancement measuring 2.8 cm and 7 lymph nodes with enlarged internal mammary lymph node, left breast numerous enhancing foci (?  Not biopsied) 2 enhancing sternal lesions lesion in the liver 1.6 cm (PET negative)   Treatment plan: Neoadjuvant chemotherapy with dose dense Adriamycin and Cytoxan x 4 followed by Taxol weekly x 12 started at [facility]: We are continuing her treatment here Bilateral Mastectomies with ALND (therefore left breast lesions have not been biopsied) Adjuvant radiation therapy Adjuvant antiestrogen therapy with CDK inhibitors  ------------------------------------------------------------------------------------------------------------------------------------------------- Current treatment: Cycle 3 dose dense Adriamycin and Cytoxan  Chemo toxicities: Fatigue Bone pain Alopecia  Anxiety and emotional distress: Uses Ativan for anxiety and sleep Echocardiogram 05/18/2023: EF 65%   Liver MRI was recommended to evaluate the liver lesions scheduled for 06/23/2023  We will try and see if Neulasta on body injector can be given instead of the Neulasta shot.  She did much better with the Neulasta on body injector.

## 2023-06-18 ENCOUNTER — Inpatient Hospital Stay: Payer: BC Managed Care – PPO | Attending: Hematology and Oncology

## 2023-06-21 ENCOUNTER — Other Ambulatory Visit: Payer: Self-pay

## 2023-06-23 ENCOUNTER — Encounter: Payer: Self-pay | Admitting: Hematology and Oncology

## 2023-06-23 ENCOUNTER — Ambulatory Visit
Admission: RE | Admit: 2023-06-23 | Discharge: 2023-06-23 | Disposition: A | Discharge status: Home / Self Care | Payer: BC Managed Care – PPO | Source: Ambulatory Visit | Attending: Hematology and Oncology | Admitting: Hematology and Oncology

## 2023-06-23 DIAGNOSIS — Z170 Estrogen receptor positive status [ER+]: Secondary | ICD-10-CM

## 2023-06-23 MED ORDER — GADOPICLENOL 0.5 MMOL/ML IV SOLN
8.0000 mL | Freq: Once | INTRAVENOUS | Status: AC | PRN
  Administered 2023-06-23: 8 mL via INTRAVENOUS

## 2023-06-29 MED FILL — Fosaprepitant Dimeglumine For IV Infusion 150 MG (Base Eq): INTRAVENOUS | Qty: 5 | Status: AC

## 2023-06-30 ENCOUNTER — Other Ambulatory Visit: Payer: BC Managed Care – PPO

## 2023-06-30 ENCOUNTER — Inpatient Hospital Stay (HOSPITAL_BASED_OUTPATIENT_CLINIC_OR_DEPARTMENT_OTHER): Payer: BC Managed Care – PPO | Admitting: Adult Health

## 2023-06-30 ENCOUNTER — Inpatient Hospital Stay: Payer: BC Managed Care – PPO

## 2023-06-30 ENCOUNTER — Encounter: Payer: Self-pay | Admitting: Hematology and Oncology

## 2023-06-30 ENCOUNTER — Telehealth: Payer: Self-pay | Admitting: *Deleted

## 2023-06-30 DIAGNOSIS — C50411 Malignant neoplasm of upper-outer quadrant of right female breast: Secondary | ICD-10-CM

## 2023-06-30 DIAGNOSIS — Z170 Estrogen receptor positive status [ER+]: Secondary | ICD-10-CM

## 2023-06-30 NOTE — Telephone Encounter (Signed)
RN attempt x1 to contact pt regarding missed appt today.  No answer, LVM for pt to return call to the office.

## 2023-07-01 ENCOUNTER — Encounter: Payer: Self-pay | Admitting: Hematology and Oncology

## 2023-07-01 NOTE — Progress Notes (Signed)
Patient did not show for appt

## 2023-07-02 ENCOUNTER — Inpatient Hospital Stay: Payer: BC Managed Care – PPO

## 2023-07-05 ENCOUNTER — Encounter: Payer: Self-pay | Admitting: *Deleted

## 2023-07-05 ENCOUNTER — Telehealth: Payer: Self-pay | Admitting: *Deleted

## 2023-07-05 NOTE — Telephone Encounter (Signed)
Received call from pt requesting to cancel all upcoming appts. Pt states she is proceeding with a non traditional treatment course with a Dr. Willaim Bane and is no longer interested in chemotherapy.  MD notified and scheduling team notified to cancel appts.

## 2023-07-14 ENCOUNTER — Ambulatory Visit: Payer: BC Managed Care – PPO | Admitting: Hematology and Oncology

## 2023-07-14 ENCOUNTER — Ambulatory Visit: Payer: BC Managed Care – PPO

## 2023-07-14 ENCOUNTER — Other Ambulatory Visit: Payer: BC Managed Care – PPO

## 2023-07-21 ENCOUNTER — Ambulatory Visit: Payer: BC Managed Care – PPO

## 2023-07-21 ENCOUNTER — Ambulatory Visit: Payer: BC Managed Care – PPO | Admitting: Adult Health

## 2023-07-21 ENCOUNTER — Other Ambulatory Visit: Payer: BC Managed Care – PPO

## 2023-07-22 ENCOUNTER — Ambulatory Visit: Payer: BC Managed Care – PPO

## 2023-07-22 ENCOUNTER — Other Ambulatory Visit: Payer: BC Managed Care – PPO

## 2023-07-22 ENCOUNTER — Ambulatory Visit: Payer: BC Managed Care – PPO | Admitting: Adult Health

## 2023-07-28 ENCOUNTER — Ambulatory Visit: Payer: BC Managed Care – PPO

## 2023-07-28 ENCOUNTER — Ambulatory Visit: Payer: BC Managed Care – PPO | Admitting: Hematology and Oncology

## 2023-07-28 ENCOUNTER — Other Ambulatory Visit: Payer: BC Managed Care – PPO

## 2023-08-05 ENCOUNTER — Other Ambulatory Visit: Payer: BC Managed Care – PPO

## 2023-08-05 ENCOUNTER — Ambulatory Visit: Payer: BC Managed Care – PPO

## 2023-08-11 ENCOUNTER — Ambulatory Visit: Payer: BC Managed Care – PPO

## 2023-08-11 ENCOUNTER — Ambulatory Visit: Payer: BC Managed Care – PPO | Admitting: Hematology and Oncology

## 2023-08-11 ENCOUNTER — Other Ambulatory Visit: Payer: BC Managed Care – PPO

## 2023-08-18 ENCOUNTER — Ambulatory Visit: Payer: BC Managed Care – PPO

## 2023-08-18 ENCOUNTER — Ambulatory Visit: Payer: BC Managed Care – PPO | Admitting: Adult Health

## 2023-08-18 ENCOUNTER — Other Ambulatory Visit: Payer: BC Managed Care – PPO

## 2023-08-25 ENCOUNTER — Other Ambulatory Visit: Payer: BC Managed Care – PPO

## 2023-08-25 ENCOUNTER — Ambulatory Visit: Payer: BC Managed Care – PPO

## 2023-08-25 ENCOUNTER — Ambulatory Visit: Payer: BC Managed Care – PPO | Admitting: Hematology and Oncology

## 2023-09-01 ENCOUNTER — Other Ambulatory Visit: Payer: BC Managed Care – PPO

## 2023-09-01 ENCOUNTER — Ambulatory Visit: Payer: BC Managed Care – PPO

## 2023-09-08 ENCOUNTER — Other Ambulatory Visit: Payer: BC Managed Care – PPO

## 2023-09-08 ENCOUNTER — Ambulatory Visit: Payer: BC Managed Care – PPO | Admitting: Adult Health

## 2023-09-08 ENCOUNTER — Ambulatory Visit: Payer: BC Managed Care – PPO

## 2023-11-13 IMAGING — MG MM DIGITAL SCREENING BILAT W/ TOMO AND CAD
8 series · 8 of 24 positions shown · non-contrast
Comparison: Previous exam(s).

CLINICAL DATA: Screening.

EXAM:
DIGITAL SCREENING BILATERAL MAMMOGRAM WITH TOMOSYNTHESIS AND CAD
TECHNIQUE: Bilateral screening digital craniocaudal and mediolateral oblique
mammograms were obtained. Bilateral screening digital breast
tomosynthesis was performed. The images were evaluated with
computer-aided detection.

[L MLO synth-2D]
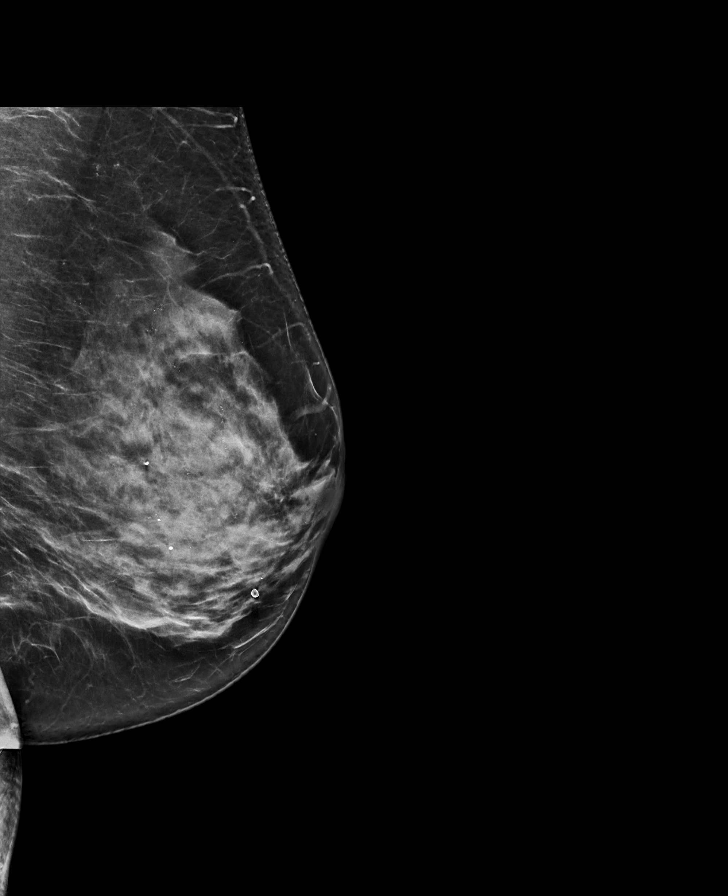

[R MLO synth-2D]
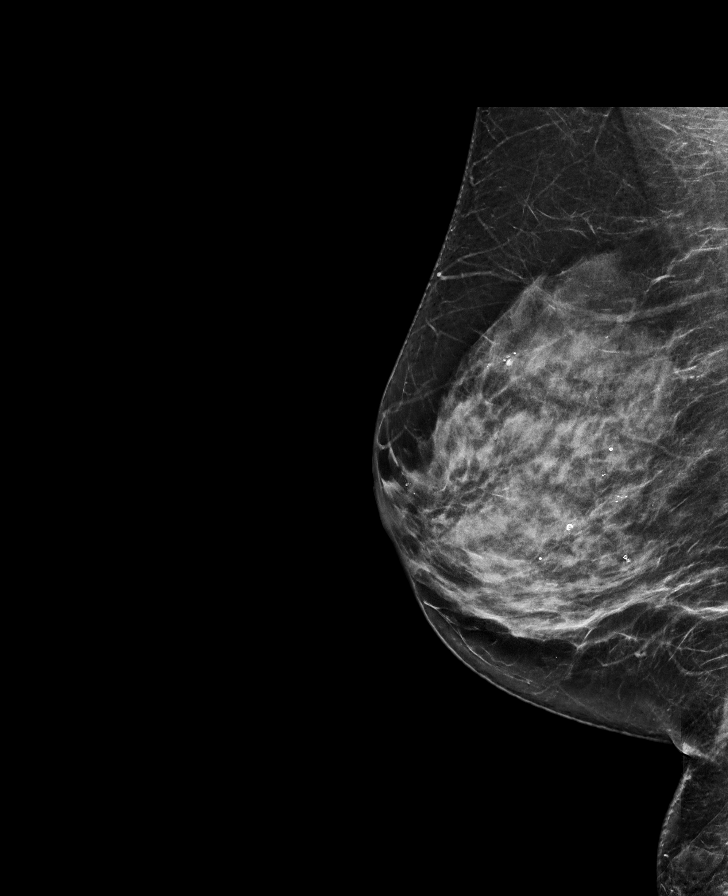

[L CC synth-2D]
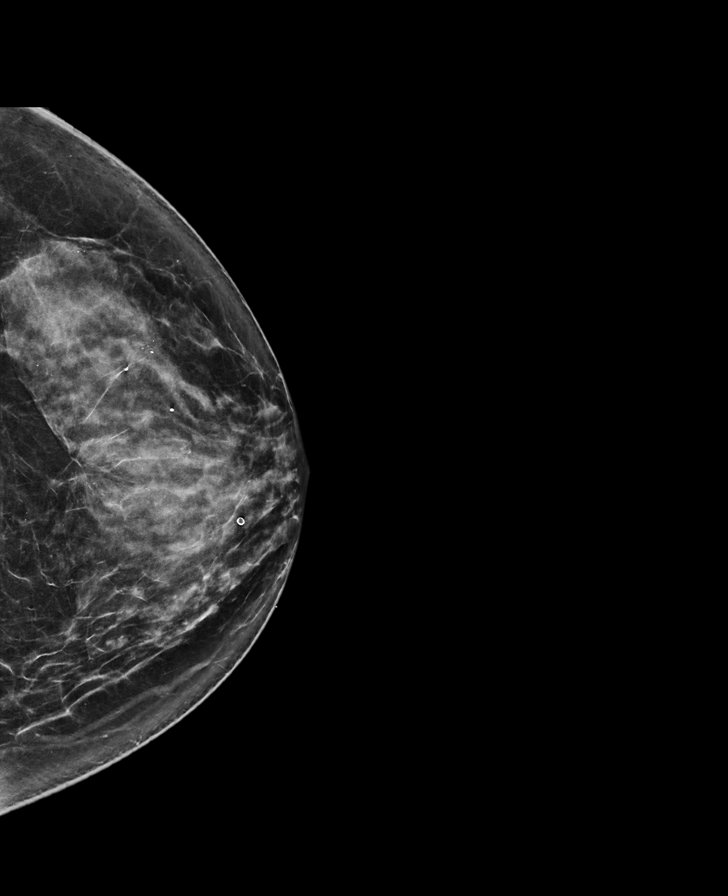

[R CC synth-2D]
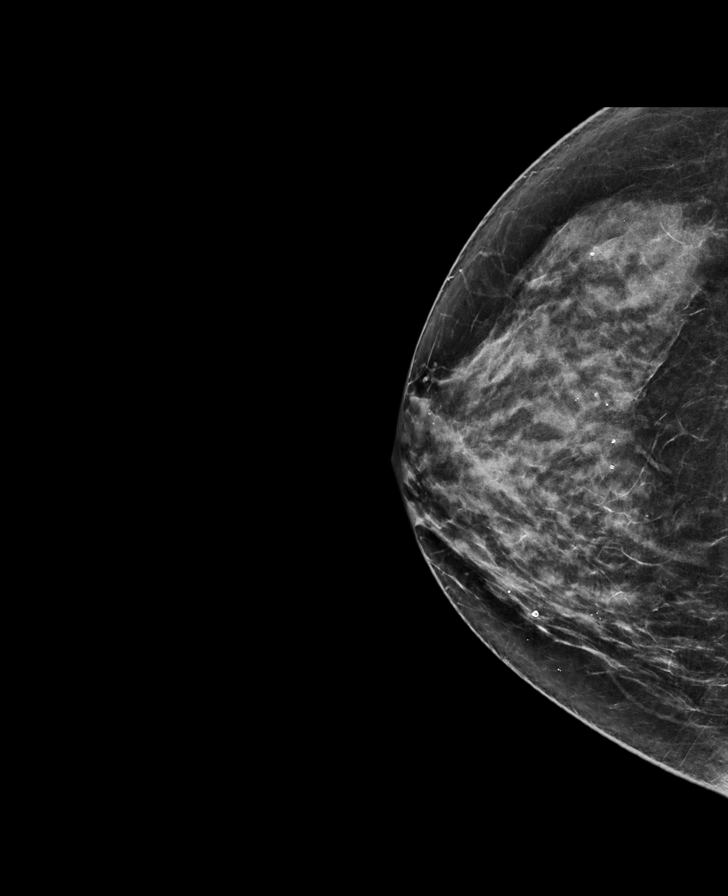

[L MLO tomo · tomo slice 37/74.0]
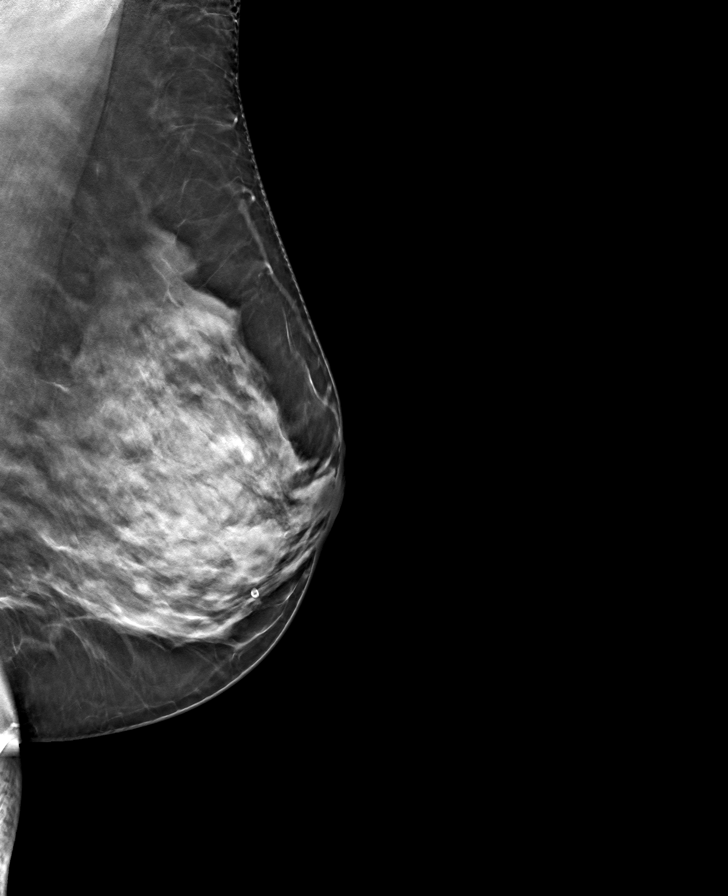

[R MLO tomo · tomo slice 38/75.0]
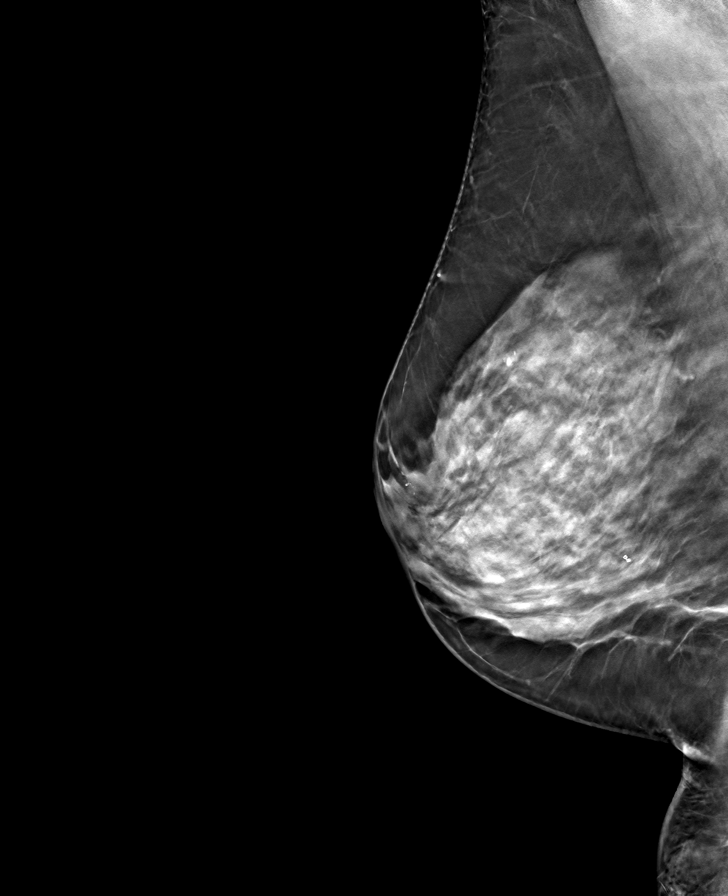

[L CC tomo · tomo slice 39/76.0]
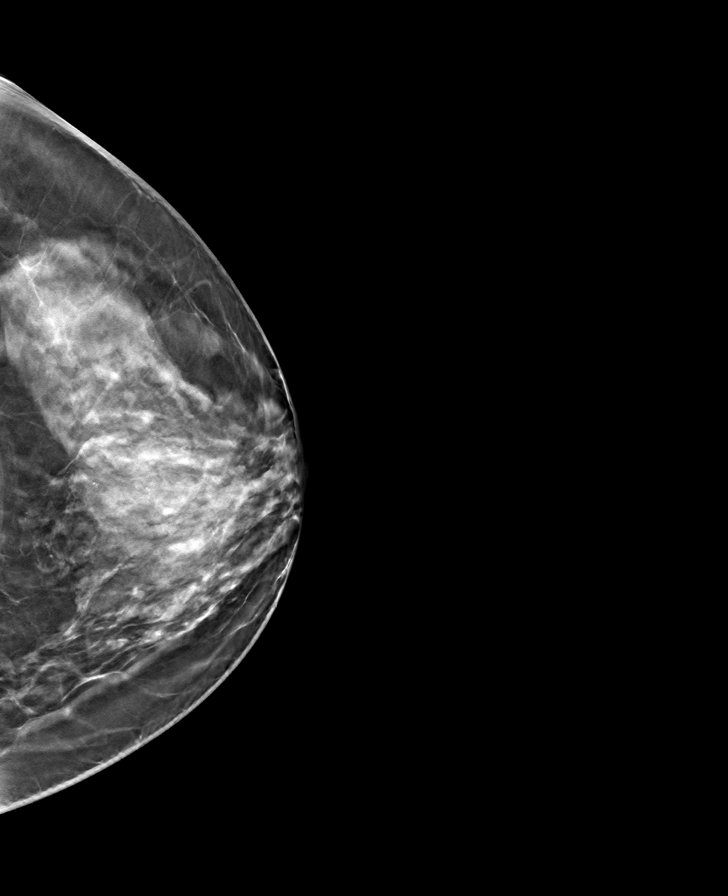

[R CC tomo · tomo slice 37/74.0]
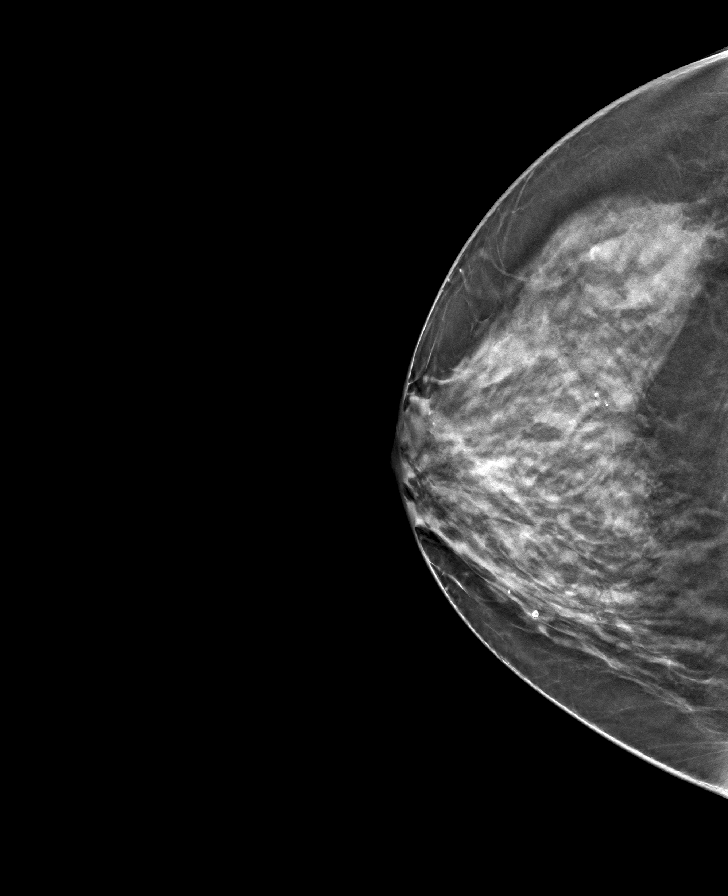

[8 of 24 positions shown; findings below may reference images not displayed]

ACR Breast Density Category c: The breast tissue is heterogeneously
dense, which may obscure small masses.
FINDINGS: There are no findings suspicious for malignancy.
IMPRESSION: No mammographic evidence of malignancy. A result letter of this
screening mammogram will be mailed directly to the patient.

RECOMMENDATION:
Screening mammogram in one year. (Code:Q3-W-BC3)

BI-RADS CATEGORY  1: Negative.
